# Patient Record
Sex: Female | Born: 1955 | Race: White | Hispanic: No | Marital: Married | Smoking: Never smoker
Health system: Southern US, Community
[De-identification: ages and names within clinical notes are randomized; demographics above are authoritative.]

## PROBLEM LIST (undated history)

## (undated) DIAGNOSIS — M1990 Unspecified osteoarthritis, unspecified site: Secondary | ICD-10-CM

## (undated) DIAGNOSIS — A6920 Lyme disease, unspecified: Secondary | ICD-10-CM

## (undated) DIAGNOSIS — E538 Deficiency of other specified B group vitamins: Secondary | ICD-10-CM

## (undated) DIAGNOSIS — N6009 Solitary cyst of unspecified breast: Secondary | ICD-10-CM

## (undated) DIAGNOSIS — N939 Abnormal uterine and vaginal bleeding, unspecified: Secondary | ICD-10-CM

## (undated) DIAGNOSIS — Z9289 Personal history of other medical treatment: Secondary | ICD-10-CM

## (undated) DIAGNOSIS — M8580 Other specified disorders of bone density and structure, unspecified site: Secondary | ICD-10-CM

## (undated) DIAGNOSIS — N6011 Diffuse cystic mastopathy of right breast: Secondary | ICD-10-CM

## (undated) DIAGNOSIS — D649 Anemia, unspecified: Secondary | ICD-10-CM

## (undated) HISTORY — DX: Abnormal uterine and vaginal bleeding, unspecified: N93.9

## (undated) HISTORY — DX: Personal history of other medical treatment: Z92.89

## (undated) HISTORY — DX: Lyme disease, unspecified: A69.20

## (undated) HISTORY — DX: Other specified disorders of bone density and structure, unspecified site: M85.80

## (undated) HISTORY — DX: Unspecified osteoarthritis, unspecified site: M19.90

## (undated) HISTORY — PX: BREAST CYST ASPIRATION: SHX578

## (undated) HISTORY — DX: Solitary cyst of unspecified breast: N60.09

---

## 2004-10-12 ENCOUNTER — Ambulatory Visit: Payer: Self-pay | Admitting: Unknown Physician Specialty

## 2005-11-26 HISTORY — PX: ENDOMETRIAL BIOPSY: SHX622

## 2005-12-04 ENCOUNTER — Ambulatory Visit: Payer: Self-pay | Admitting: Unknown Physician Specialty

## 2006-12-10 ENCOUNTER — Ambulatory Visit: Payer: Self-pay | Admitting: Unknown Physician Specialty

## 2007-04-10 ENCOUNTER — Ambulatory Visit: Payer: Self-pay | Admitting: Gastroenterology

## 2007-04-10 HISTORY — PX: COLONOSCOPY: SHX174

## 2008-01-06 ENCOUNTER — Ambulatory Visit: Payer: Self-pay | Admitting: Unknown Physician Specialty

## 2009-01-09 ENCOUNTER — Ambulatory Visit: Payer: Self-pay | Admitting: General Surgery

## 2010-02-05 ENCOUNTER — Ambulatory Visit: Payer: Self-pay | Admitting: General Surgery

## 2011-02-07 ENCOUNTER — Ambulatory Visit: Payer: Self-pay | Admitting: General Surgery

## 2012-02-18 ENCOUNTER — Ambulatory Visit: Payer: Self-pay | Admitting: Internal Medicine

## 2016-04-29 DIAGNOSIS — E538 Deficiency of other specified B group vitamins: Secondary | ICD-10-CM | POA: Insufficient documentation

## 2016-04-29 DIAGNOSIS — Z8619 Personal history of other infectious and parasitic diseases: Secondary | ICD-10-CM | POA: Insufficient documentation

## 2016-05-14 ENCOUNTER — Other Ambulatory Visit: Payer: Self-pay | Admitting: Obstetrics and Gynecology

## 2016-05-14 DIAGNOSIS — Z1231 Encounter for screening mammogram for malignant neoplasm of breast: Secondary | ICD-10-CM

## 2016-05-14 LAB — HM PAP SMEAR

## 2016-06-03 ENCOUNTER — Ambulatory Visit
Admission: RE | Admit: 2016-06-03 | Discharge: 2016-06-03 | Disposition: A | Discharge status: Home / Self Care | Payer: BC Managed Care – PPO | Source: Ambulatory Visit | Attending: Obstetrics and Gynecology | Admitting: Obstetrics and Gynecology

## 2016-06-03 ENCOUNTER — Other Ambulatory Visit: Payer: Self-pay | Admitting: Obstetrics and Gynecology

## 2016-06-03 DIAGNOSIS — Z1231 Encounter for screening mammogram for malignant neoplasm of breast: Secondary | ICD-10-CM

## 2016-06-03 LAB — HM MAMMOGRAPHY

## 2016-10-21 DIAGNOSIS — A6920 Lyme disease, unspecified: Secondary | ICD-10-CM

## 2016-10-21 HISTORY — DX: Lyme disease, unspecified: A69.20

## 2017-02-20 ENCOUNTER — Other Ambulatory Visit: Payer: Self-pay | Admitting: Obstetrics and Gynecology

## 2017-02-20 DIAGNOSIS — Z1231 Encounter for screening mammogram for malignant neoplasm of breast: Secondary | ICD-10-CM

## 2017-05-15 ENCOUNTER — Ambulatory Visit (INDEPENDENT_AMBULATORY_CARE_PROVIDER_SITE_OTHER): Payer: BC Managed Care – PPO | Admitting: Obstetrics and Gynecology

## 2017-05-15 ENCOUNTER — Encounter: Payer: Self-pay | Admitting: Obstetrics and Gynecology

## 2017-05-15 VITALS — BP 108/62 | HR 79 | Ht 65.5 in | Wt 131.0 lb

## 2017-05-15 DIAGNOSIS — Z01419 Encounter for gynecological examination (general) (routine) without abnormal findings: Secondary | ICD-10-CM | POA: Diagnosis not present

## 2017-05-15 DIAGNOSIS — Z1231 Encounter for screening mammogram for malignant neoplasm of breast: Secondary | ICD-10-CM

## 2017-05-15 DIAGNOSIS — I341 Nonrheumatic mitral (valve) prolapse: Secondary | ICD-10-CM | POA: Insufficient documentation

## 2017-05-15 DIAGNOSIS — N6011 Diffuse cystic mastopathy of right breast: Secondary | ICD-10-CM | POA: Insufficient documentation

## 2017-05-15 DIAGNOSIS — Z1239 Encounter for other screening for malignant neoplasm of breast: Secondary | ICD-10-CM

## 2017-05-15 DIAGNOSIS — N6012 Diffuse cystic mastopathy of left breast: Secondary | ICD-10-CM

## 2017-05-15 NOTE — Progress Notes (Signed)
Chief Complaint  Patient presents with  . Gynecologic Exam    HPI:      [name] is a 61 y.o. [phone] who LMP was No LMP recorded. Patient is postmenopausal., presents today for her annual examination.  Her menses are absent and she is postmenopausal. She does not have intermenstrual bleeding.  She does have vasomotor sx that are mostly tolerable.  Sex activity: single partner, contraception - post menopausal status. She does have vaginal dryness and uses lubricants with relief.  Last Pap: May 14, 2016  Results were: no abnormalities /neg HPV DNA.  Hx of STDs: none  Last mammogram: June 03, 2016  Results were: normal--routine follow-up in 12 months There is no FH of breast cancer. There is no FH of ovarian cancer. The patient does do self-breast exams.  Colonoscopy: colonoscopy 10 years ago without abnormalities. She is due for repeat colonoscopy and has it scheduled this yr.   Tobacco use: The patient denies current or previous tobacco use. Alcohol use: none Exercise: moderately active  She does get adequate calcium and Vitamin D in her diet.  Labs with PCP.  Past Medical History:  Diagnosis Date  . Abnormal uterine bleeding   . History of mammogram 05/12/15; 06/03/16   BIRAD 2; NEG  . History of Papanicolaou smear of cervix 05/12/2015; 05/14/2016   NEG; -/-  . Lyme disease 2018  . Solitary cyst of breast    NEG    Past Surgical History:  Procedure Laterality Date  . BREAST CYST ASPIRATION Bilateral 07/29/97;02/28/00;111/4/00   RT; LT; LT; RT  . [place]  . ENDOMETRIAL BIOPSY  11/26/2005   EARLY SECRETORY ENDOMETRIUM C SLIGHTLY DISORDERED PATTERN    Family History  Problem Relation Age of Onset  . Coronary artery disease Mother   . Cerebrovascular Accident Mother   . Hyperlipidemia Mother   . Hypertension Mother   . Chronic Renal Failure Mother     Social History   Social History  . Marital status: Married    Spouse name: N/A   . Number of children: 3  . Years of education: 8   Occupational History  . TEACHER    Social History Main Topics  . Smoking status: Never Smoker  . Smokeless tobacco: Never Used  . Alcohol use No  . Drug use: No  . Sexual activity: Yes    Birth control/ protection: Post-menopausal   Other Topics Concern  . Not on file   Social History Narrative  . No narrative on file     Current Outpatient Prescriptions:  .  aspirin 81 MG chewable tablet, Chew 81 mg by mouth daily., Disp: , Rfl:  .  Cholecalciferol (VITAMIN D3) 1000 units CAPS, Take 1 capsule by mouth daily., Disp: , Rfl:  .  Cobalamine Combinations (VITAMIN B12-FOLIC ACID) 892-119 MCG TABS, Take 1 tablet by mouth daily., Disp: , Rfl:  .  vitamin C (ASCORBIC ACID) 500 MG tablet, Take 500 mg by mouth daily., Disp: , Rfl:    ROS:  Review of Systems  Constitutional: Negative for fatigue, fever and unexpected weight change.  Respiratory: Negative for cough, shortness of breath and wheezing.   Cardiovascular: Negative for chest pain, palpitations and leg swelling.  Gastrointestinal: Negative for blood in stool, constipation, diarrhea, nausea and vomiting.  Endocrine: Negative for cold intolerance, heat intolerance and polyuria.  Genitourinary: Negative for dyspareunia, dysuria, flank pain, frequency, genital sores, hematuria, menstrual problem, pelvic pain, urgency, vaginal bleeding, vaginal discharge  and vaginal pain.  Musculoskeletal: Negative for back pain, joint swelling and myalgias.  Skin: Negative for rash.  Neurological: Negative for dizziness, syncope, light-headedness, numbness and headaches.  Hematological: Negative for adenopathy.  Psychiatric/Behavioral: Negative for agitation, confusion, sleep disturbance and suicidal ideas. The patient is not nervous/anxious.      Objective: BP 108/62 (BP Location: Left Arm, Patient Position: Sitting, Cuff Size: Normal)   Pulse 79   Ht 5' 5.5" (1.664 m)   Wt 131 lb  (59.4 kg)   BMI 21.47 kg/m    Physical Exam  Constitutional: She is oriented to person, place, and time. She appears well-developed and well-nourished.  Genitourinary: Vagina normal and uterus normal. There is no rash or tenderness on the right labia. There is no rash or tenderness on the left labia. No erythema or tenderness in the vagina. No vaginal discharge found. Right adnexum does not display mass and does not display tenderness. Left adnexum does not display mass and does not display tenderness. Cervix does not exhibit motion tenderness or polyp. Uterus is not enlarged or tender.  Neck: Normal range of motion. No thyromegaly present.  Cardiovascular: Normal rate, regular rhythm and normal heart sounds.   No murmur heard. Pulmonary/Chest: Effort normal and breath sounds normal. Right breast exhibits no mass, no nipple discharge, no skin change and no tenderness. Left breast exhibits no mass, no nipple discharge, no skin change and no tenderness.  Abdominal: Soft. There is no tenderness. There is no guarding.  Musculoskeletal: Normal range of motion.  Neurological: She is alert and oriented to person, place, and time. No cranial nerve deficit.  Psychiatric: She has a normal mood and affect. Her behavior is normal.  Vitals reviewed.    Assessment/Plan:  Encounter for annual routine gynecological examination  Screening for breast cancer - Pt has mammo sched 8/18.          GYN counsel mammography screening, adequate intake of calcium and vitamin D, diet and exercise    F/U  Return in about 1 year (around 05/15/2018).  Paola Flynt B. Aeris Hersman, PA-C 05/15/2017 8:56 AM

## 2017-06-04 ENCOUNTER — Ambulatory Visit
Admission: RE | Admit: 2017-06-04 | Discharge: 2017-06-04 | Disposition: A | Discharge status: Home / Self Care | Payer: BC Managed Care – PPO | Source: Ambulatory Visit | Attending: Obstetrics and Gynecology | Admitting: Obstetrics and Gynecology

## 2017-06-04 ENCOUNTER — Other Ambulatory Visit: Payer: Self-pay | Admitting: Obstetrics and Gynecology

## 2017-06-04 DIAGNOSIS — Z1231 Encounter for screening mammogram for malignant neoplasm of breast: Secondary | ICD-10-CM

## 2017-06-05 ENCOUNTER — Encounter: Payer: Self-pay | Admitting: Obstetrics and Gynecology

## 2017-08-15 HISTORY — PX: COLONOSCOPY: SHX174

## 2018-04-27 ENCOUNTER — Other Ambulatory Visit: Payer: Self-pay | Admitting: Obstetrics and Gynecology

## 2018-04-27 ENCOUNTER — Other Ambulatory Visit: Payer: Self-pay | Admitting: Internal Medicine

## 2018-04-27 DIAGNOSIS — Z1231 Encounter for screening mammogram for malignant neoplasm of breast: Secondary | ICD-10-CM

## 2018-06-05 ENCOUNTER — Ambulatory Visit
Admission: RE | Admit: 2018-06-05 | Discharge: 2018-06-05 | Disposition: A | Discharge status: Home / Self Care | Payer: BC Managed Care – PPO | Source: Ambulatory Visit | Attending: Internal Medicine | Admitting: Internal Medicine

## 2018-06-05 DIAGNOSIS — Z1231 Encounter for screening mammogram for malignant neoplasm of breast: Secondary | ICD-10-CM | POA: Diagnosis present

## 2018-06-09 ENCOUNTER — Other Ambulatory Visit: Payer: Self-pay | Admitting: Internal Medicine

## 2018-06-09 DIAGNOSIS — R928 Other abnormal and inconclusive findings on diagnostic imaging of breast: Secondary | ICD-10-CM

## 2018-07-03 ENCOUNTER — Ambulatory Visit
Admission: RE | Admit: 2018-07-03 | Discharge: 2018-07-03 | Disposition: A | Discharge status: Home / Self Care | Payer: BC Managed Care – PPO | Source: Ambulatory Visit | Attending: Internal Medicine | Admitting: Internal Medicine

## 2018-07-03 DIAGNOSIS — R928 Other abnormal and inconclusive findings on diagnostic imaging of breast: Secondary | ICD-10-CM | POA: Diagnosis present

## 2018-07-27 ENCOUNTER — Ambulatory Visit (INDEPENDENT_AMBULATORY_CARE_PROVIDER_SITE_OTHER): Payer: BC Managed Care – PPO | Admitting: Obstetrics and Gynecology

## 2018-07-27 ENCOUNTER — Encounter: Payer: Self-pay | Admitting: Obstetrics and Gynecology

## 2018-07-27 VITALS — BP 126/60 | HR 82 | Ht 66.0 in | Wt 142.0 lb

## 2018-07-27 DIAGNOSIS — Z01419 Encounter for gynecological examination (general) (routine) without abnormal findings: Secondary | ICD-10-CM | POA: Diagnosis not present

## 2018-07-27 DIAGNOSIS — Z1239 Encounter for other screening for malignant neoplasm of breast: Secondary | ICD-10-CM | POA: Diagnosis not present

## 2018-07-27 NOTE — Patient Instructions (Signed)
I value your feedback and entrusting us with your care. If you get a [facility] patient survey, I would appreciate you taking the time to let us know about your experience today. Thank you! 

## 2018-07-27 NOTE — Progress Notes (Signed)
Chief Complaint  Patient presents with  . Gynecologic Exam    HPI:      [name] is a 62 y.o. [phone] who LMP was No LMP recorded. Patient is postmenopausal., presents today for her annual examination.  Her menses are absent and she is postmenopausal. She does not have intermenstrual bleeding.  She does have vasomotor sx that are tolerable.  Sex activity: single partner, contraception - post menopausal status. She does have vaginal dryness and uses lubricants with relief.  Last Pap: May 14, 2016  Results were: no abnormalities /neg HPV DNA.  Hx of STDs: none  Last mammogram: 07/03/18  Results were: normal--routine follow-up in 12 months There is no FH of breast cancer. There is no FH of ovarian cancer. The patient does do self-breast exams.  Colonoscopy: colonoscopy <1 yr ago with abnormalities. Repeat due after 5 yrs.  Tobacco use: The patient denies current or previous tobacco use. Alcohol use: none Exercise: moderately active  She does get adequate calcium and Vitamin D in her diet.  Labs with PCP.  Past Medical History:  Diagnosis Date  . Abnormal uterine bleeding   . History of mammogram 05/12/15; 06/03/16   BIRAD 2; NEG  . History of Papanicolaou smear of cervix 05/12/2015; 05/14/2016   NEG; -/-  . Lyme disease 2018  . Solitary cyst of breast    NEG    Past Surgical History:  Procedure Laterality Date  . BREAST CYST ASPIRATION Bilateral 07/29/97;02/28/00;111/4/00   RT; LT; LT; RT  . [place]  . ENDOMETRIAL BIOPSY  11/26/2005   EARLY SECRETORY ENDOMETRIUM C SLIGHTLY DISORDERED PATTERN    Family History  Problem Relation Age of Onset  . Coronary artery disease Mother   . Cerebrovascular Accident Mother   . Hyperlipidemia Mother   . Hypertension Mother   . Chronic Renal Failure Mother   . Hypercholesterolemia Mother   . Transient ischemic attack Father 71  . Breast cancer Neg Hx     Social History   Socioeconomic History  .  Marital status: Married    Spouse name: Not on file  . Number of children: 3  . Years of education: 56  . Highest education level: Not on file  Occupational History  . Occupation: TEACHER  Social Needs  . Financial resource strain: Not on file  . Food insecurity:    Worry: Not on file    Inability: Not on file  . Transportation needs:    Medical: Not on file    Non-medical: Not on file  Tobacco Use  . Smoking status: Never Smoker  . Smokeless tobacco: Never Used  Substance and Sexual Activity  . Alcohol use: No  . Drug use: No  . Sexual activity: Yes    Birth control/protection: Post-menopausal  Lifestyle  . Physical activity:    Days per week: Not on file    Minutes per session: Not on file  . Stress: Not on file  Relationships  . Social connections:    Talks on phone: Not on file    Gets together: Not on file    Attends religious service: Not on file    Active member of club or organization: Not on file    Attends meetings of clubs or organizations: Not on file    Relationship status: Not on file  . Intimate partner violence:    Fear of current or ex partner: Not on file    Emotionally abused: Not on file  Physically abused: Not on file    Forced sexual activity: Not on file  Other Topics Concern  . Not on file  Social History Narrative  . Not on file     Current Outpatient Medications:  .  acyclovir ointment (ZOVIRAX) 5 %, Apply topically., Disp: , Rfl:  .  aspirin 81 MG chewable tablet, Chew 81 mg by mouth daily., Disp: , Rfl:  .  Cholecalciferol (VITAMIN D3) 1000 units CAPS, Take 1 capsule by mouth daily., Disp: , Rfl:  .  vitamin C (ASCORBIC ACID) 500 MG tablet, Take 500 mg by mouth daily., Disp: , Rfl:    ROS:  Review of Systems  Constitutional: Negative for fatigue, fever and unexpected weight change.  Respiratory: Negative for cough, shortness of breath and wheezing.   Cardiovascular: Negative for chest pain, palpitations and leg swelling.    Gastrointestinal: Negative for blood in stool, constipation, diarrhea, nausea and vomiting.  Endocrine: Negative for cold intolerance, heat intolerance and polyuria.  Genitourinary: Negative for dyspareunia, dysuria, flank pain, frequency, genital sores, hematuria, menstrual problem, pelvic pain, urgency, vaginal bleeding, vaginal discharge and vaginal pain.  Musculoskeletal: Negative for back pain, joint swelling and myalgias.  Skin: Negative for rash.  Neurological: Negative for dizziness, syncope, light-headedness, numbness and headaches.  Hematological: Negative for adenopathy.  Psychiatric/Behavioral: Negative for agitation, confusion, sleep disturbance and suicidal ideas. The patient is not nervous/anxious.      Objective: BP 126/60   Pulse 82   Ht 5\' 6"  (1.676 m)   Wt 142 lb (64.4 kg)   BMI 22.92 kg/m    Physical Exam  Constitutional: She is oriented to person, place, and time. She appears well-developed and well-nourished.  Genitourinary: Vagina normal and uterus normal. There is no rash or tenderness on the right labia. There is no rash or tenderness on the left labia. No erythema or tenderness in the vagina. No vaginal discharge found. Right adnexum does not display mass and does not display tenderness. Left adnexum does not display mass and does not display tenderness. Cervix does not exhibit motion tenderness or polyp. Uterus is not enlarged or tender.  Neck: Normal range of motion. No thyromegaly present.  Cardiovascular: Normal rate, regular rhythm and normal heart sounds.  No murmur heard. Pulmonary/Chest: Effort normal and breath sounds normal. Right breast exhibits no mass, no nipple discharge, no skin change and no tenderness. Left breast exhibits no mass, no nipple discharge, no skin change and no tenderness.  Abdominal: Soft. There is no tenderness. There is no guarding.  Musculoskeletal: Normal range of motion.  Neurological: She is alert and oriented to person,  place, and time. No cranial nerve deficit.  Psychiatric: She has a normal mood and affect. Her behavior is normal.  Vitals reviewed.    Assessment/Plan:  Encounter for annual routine gynecological examination  Screening for breast cancer - Pt current on mammo          GYN counsel mammography screening, adequate intake of calcium and vitamin D, diet and exercise    F/U  Return in about 1 year (around 07/28/2019).  Alicia B. Copland, PA-C 07/27/2018 4:22 PM

## 2018-08-04 ENCOUNTER — Encounter: Payer: Self-pay | Admitting: Obstetrics and Gynecology

## 2018-08-04 ENCOUNTER — Ambulatory Visit (INDEPENDENT_AMBULATORY_CARE_PROVIDER_SITE_OTHER): Payer: BC Managed Care – PPO | Admitting: Obstetrics and Gynecology

## 2018-08-04 VITALS — BP 144/70 | Ht 66.0 in | Wt 140.0 lb

## 2018-08-04 DIAGNOSIS — R3121 Asymptomatic microscopic hematuria: Secondary | ICD-10-CM

## 2018-08-04 DIAGNOSIS — R102 Pelvic and perineal pain: Secondary | ICD-10-CM

## 2018-08-04 LAB — POCT URINALYSIS DIPSTICK
BILIRUBIN UA: NEGATIVE
Glucose, UA: NEGATIVE
KETONES UA: NEGATIVE
Leukocytes, UA: NEGATIVE
Nitrite, UA: NEGATIVE
PH UA: 6.5 (ref 5.0–8.0)
Protein, UA: NEGATIVE
SPEC GRAV UA: 1.015 (ref 1.010–1.025)

## 2018-08-04 NOTE — Patient Instructions (Signed)
I value your feedback and entrusting us with your care. If you get a [facility] patient survey, I would appreciate you taking the time to let us know about your experience today. Thank you! 

## 2018-08-04 NOTE — Progress Notes (Signed)
Rusty Aus, MD   Chief Complaint  Patient presents with  . Pelvic Pain    since last wednesday pelvic pain, and sat had a fever, on tamiflu since then even though test was neg    HPI:      Ms. KAEDYNCE TAPP is a 62 y.o. [phone] who LMP was No LMP recorded. Patient is postmenopausal., presents today for pelvic discomfort since last wk. Started out with bloating and lower pelvic ache. Then developed diffuse myalgias, arthralgias, fever, headache. Had neg flu test but started on tamiflu anyway at Urgent Care a few days ago. Fevers, myalgias, HA resolved but pt still has achy pelvic sx. Hurts to sit. Tried NSAIDs last night; sx worse at night, better during the day. Has mild suprapubic pressure/ mild dysuria, LBP. No vag sx, no frequency/urgency. No GI sx. Hx of microscopic hematuria on UA with PCP that resolved with rechk.  No recent hx of UTIs but had them in the past. Last annual last wk.  Past Medical History:  Diagnosis Date  . Abnormal uterine bleeding   . History of mammogram 05/12/15; 06/03/16   BIRAD 2; NEG  . History of Papanicolaou smear of cervix 05/12/2015; 05/14/2016   NEG; -/-  . Lyme disease 2018  . Solitary cyst of breast    NEG    Past Surgical History:  Procedure Laterality Date  . BREAST CYST ASPIRATION Bilateral 07/29/97;02/28/00;111/4/00   RT; LT; LT; RT  . [place]  . ENDOMETRIAL BIOPSY  11/26/2005   EARLY SECRETORY ENDOMETRIUM C SLIGHTLY DISORDERED PATTERN    Family History  Problem Relation Age of Onset  . Coronary artery disease Mother   . Cerebrovascular Accident Mother   . Hyperlipidemia Mother   . Hypertension Mother   . Chronic Renal Failure Mother   . Hypercholesterolemia Mother   . Transient ischemic attack Father 54  . Breast cancer Neg Hx     Social History   Socioeconomic History  . Marital status: Married    Spouse name: Not on file  . Number of children: 3  . Years of education: 13  . Highest education level:  Not on file  Occupational History  . Occupation: TEACHER  Social Needs  . Financial resource strain: Not on file  . Food insecurity:    Worry: Not on file    Inability: Not on file  . Transportation needs:    Medical: Not on file    Non-medical: Not on file  Tobacco Use  . Smoking status: Never Smoker  . Smokeless tobacco: Never Used  Substance and Sexual Activity  . Alcohol use: No  . Drug use: No  . Sexual activity: Yes    Birth control/protection: Post-menopausal  Lifestyle  . Physical activity:    Days per week: Not on file    Minutes per session: Not on file  . Stress: Not on file  Relationships  . Social connections:    Talks on phone: Not on file    Gets together: Not on file    Attends religious service: Not on file    Active member of club or organization: Not on file    Attends meetings of clubs or organizations: Not on file    Relationship status: Not on file  . Intimate partner violence:    Fear of current or ex partner: Not on file    Emotionally abused: Not on file    Physically abused: Not on file  Forced sexual activity: Not on file  Other Topics Concern  . Not on file  Social History Narrative  . Not on file    Outpatient Medications Prior to Visit  Medication Sig Dispense Refill  . aspirin 81 MG chewable tablet Chew 81 mg by mouth daily.    . Cholecalciferol (VITAMIN D3) 1000 units CAPS Take 1 capsule by mouth daily.    Marland Kitchen ibuprofen (ADVIL,MOTRIN) 200 MG tablet Take by mouth.    . oseltamivir (TAMIFLU) 75 MG capsule Take by mouth.    . vitamin C (ASCORBIC ACID) 500 MG tablet Take 500 mg by mouth daily.    Marland Kitchen acyclovir ointment (ZOVIRAX) 5 % Apply topically.     No facility-administered medications prior to visit.       ROS:  Review of Systems  Constitutional: Positive for fever. Negative for fatigue and unexpected weight change.  Respiratory: Negative for cough, shortness of breath and wheezing.   Cardiovascular: Negative for chest pain,  palpitations and leg swelling.  Gastrointestinal: Negative for blood in stool, constipation, diarrhea, nausea and vomiting.  Endocrine: Negative for cold intolerance, heat intolerance and polyuria.  Genitourinary: Positive for dysuria and pelvic pain. Negative for dyspareunia, flank pain, frequency, genital sores, hematuria, menstrual problem, urgency, vaginal bleeding, vaginal discharge and vaginal pain.  Musculoskeletal: Positive for arthralgias, back pain and myalgias. Negative for joint swelling.  Skin: Negative for rash.  Neurological: Positive for headaches. Negative for dizziness, syncope, light-headedness and numbness.  Hematological: Negative for adenopathy.  Psychiatric/Behavioral: Negative for agitation, confusion, sleep disturbance and suicidal ideas. The patient is not nervous/anxious.     OBJECTIVE:   Vitals:  BP (!) 144/70   Ht 5\' 6"  (1.676 m)   Wt 140 lb (63.5 kg)   BMI 22.60 kg/m   Physical Exam  Constitutional: She is oriented to person, place, and time. Vital signs are normal. She appears well-developed.  Neck: Normal range of motion.  Pulmonary/Chest: Effort normal.  Abdominal: Soft. There is no tenderness. There is no CVA tenderness.  Genitourinary: Vagina normal and uterus normal. There is no rash, tenderness or lesion on the right labia. There is no rash, tenderness or lesion on the left labia. Uterus is not enlarged and not tender. Cervix exhibits no motion tenderness. Right adnexum displays no mass and no tenderness. Left adnexum displays no mass and no tenderness. No erythema or tenderness in the vagina. No vaginal discharge found.  Musculoskeletal: Normal range of motion.  Neurological: She is alert and oriented to person, place, and time. No cranial nerve deficit.  Psychiatric: She has a normal mood and affect. Her behavior is normal. Judgment and thought content normal.  Vitals reviewed.   Results: Results for orders placed or performed in visit on  08/04/18 (from the past 24 hour(s))  POCT Urinalysis Dipstick     Status: Abnormal   Collection Time: 08/04/18  2:11 PM  Result Value Ref Range   Color, UA yellow    Clarity, UA clear    Glucose, UA Negative Negative   Bilirubin, UA neg    Ketones, UA neg    Spec Grav, UA 1.015 1.010 - 1.025   Blood, UA mod    pH, UA 6.5 5.0 - 8.0   Protein, UA Negative Negative   Urobilinogen, UA     Nitrite, UA neg    Leukocytes, UA Negative Negative   Appearance     Odor       Assessment/Plan: Pelvic pain - Neg exam. Sx improved in  day, worse at night. With viral illness. Question related. Hematuria on dip. Check C&S. Will f/u wiht results/NSAIDs. If neg, chk u/s - Plan: POCT Urinalysis Dipstick, Urine Culture  Asymptomatic microscopic hematuria - If culture neg, will rechk UA. - Plan: POCT Urinalysis Dipstick, Urine Culture    Return if symptoms worsen or fail to improve.  Alicia B. Copland, PA-C 08/04/2018 2:17 PM

## 2018-08-06 ENCOUNTER — Telehealth: Payer: Self-pay | Admitting: Obstetrics and Gynecology

## 2018-08-06 LAB — URINE CULTURE: ORGANISM ID, BACTERIA: NO GROWTH

## 2018-08-06 NOTE — Telephone Encounter (Signed)
Pt aware of neg C&S. Had hematuria on UA with hx of that in past. Pt taking NSAIDs for pelvic pain. Pain is improving.  RTO in 2 wks for urine rechk. Pt to make nurse appt for UA.

## 2018-08-31 ENCOUNTER — Ambulatory Visit (INDEPENDENT_AMBULATORY_CARE_PROVIDER_SITE_OTHER): Payer: BC Managed Care – PPO

## 2018-08-31 DIAGNOSIS — Z09 Encounter for follow-up examination after completed treatment for conditions other than malignant neoplasm: Secondary | ICD-10-CM

## 2018-08-31 LAB — POCT URINALYSIS DIPSTICK
Bilirubin, UA: NEGATIVE
Blood, UA: NEGATIVE
Glucose, UA: NEGATIVE
Ketones, UA: NEGATIVE
LEUKOCYTES UA: NEGATIVE
NITRITE UA: NEGATIVE
PH UA: 5 (ref 5.0–8.0)
Protein, UA: POSITIVE — AB
Spec Grav, UA: 1.015 (ref 1.010–1.025)
Urobilinogen, UA: 0.2 E.U./dL

## 2018-08-31 NOTE — Progress Notes (Signed)
Any blood in her urine?

## 2018-09-01 NOTE — Progress Notes (Signed)
LM for pt that urine doesn't show any blood. Nothing else to do at this time.

## 2018-09-01 NOTE — Progress Notes (Signed)
Done

## 2018-09-01 NOTE — Progress Notes (Signed)
Ok, can you edit result to put the normals in so we'll know in future when we look back at labs? Thx.

## 2019-08-12 NOTE — Progress Notes (Signed)
Chief Complaint  Patient presents with  . Gynecologic Exam    HPI:      [name] is a 63 y.o. [phone] who LMP was No LMP recorded. Patient is postmenopausal., presents today for her annual examination.  Her menses are absent and she is postmenopausal. She does not have intermenstrual bleeding. Pelvic pain from 10/19 resolved.  She does have vasomotor sx that are tolerable.  Sex activity: single partner, contraception - post menopausal status. She does have vaginal dryness and uses lubricants with relief.  Last Pap: May 14, 2016  Results were: no abnormalities /neg HPV DNA.  Hx of STDs: none  Last mammogram: 07/03/18  Results were: normal--routine follow-up in 12 months There is no FH of breast cancer. There is no FH of ovarian cancer. The patient does do self-breast exams.  Colonoscopy: colonoscopy 2 yrs  ago with abnormalities. Repeat due after 5 yrs.  Tobacco use: The patient denies current or previous tobacco use. Alcohol use: none  No drug use.  Exercise: moderately active  She does get adequate calcium and Vitamin D in her diet.  Labs with PCP.  Past Medical History:  Diagnosis Date  . Abnormal uterine bleeding   . History of mammogram 05/12/15; 06/03/16   BIRAD 2; NEG  . History of Papanicolaou smear of cervix 05/12/2015; 05/14/2016   NEG; -/-  . Lyme disease 2018  . Solitary cyst of breast    NEG    Past Surgical History:  Procedure Laterality Date  . BREAST CYST ASPIRATION Bilateral 07/29/97;02/28/00;111/4/00   RT; LT; LT; RT  . [place]  . ENDOMETRIAL BIOPSY  11/26/2005   EARLY SECRETORY ENDOMETRIUM C SLIGHTLY DISORDERED PATTERN    Family History  Problem Relation Age of Onset  . Coronary artery disease Mother   . Cerebrovascular Accident Mother   . Hyperlipidemia Mother   . Hypertension Mother   . Chronic Renal Failure Mother   . Hypercholesterolemia Mother   . Transient ischemic attack Father 59  . Breast cancer Neg Hx      Social History   Socioeconomic History  . Marital status: Married    Spouse name: Not on file  . Number of children: 3  . Years of education: 69  . Highest education level: Not on file  Occupational History  . Occupation: TEACHER  Social Needs  . Financial resource strain: Not on file  . Food insecurity    Worry: Not on file    Inability: Not on file  . Transportation needs    Medical: Not on file    Non-medical: Not on file  Tobacco Use  . Smoking status: Never Smoker  . Smokeless tobacco: Never Used  Substance and Sexual Activity  . Alcohol use: No  . Drug use: No  . Sexual activity: Yes    Birth control/protection: Post-menopausal  Lifestyle  . Physical activity    Days per week: Not on file    Minutes per session: Not on file  . Stress: Not on file  Relationships  . Social Herbalist on phone: Not on file    Gets together: Not on file    Attends religious service: Not on file    Active member of club or organization: Not on file    Attends meetings of clubs or organizations: Not on file    Relationship status: Not on file  . Intimate partner violence    Fear of current or ex partner:  Not on file    Emotionally abused: Not on file    Physically abused: Not on file    Forced sexual activity: Not on file  Other Topics Concern  . Not on file  Social History Narrative  . Not on file     Current Outpatient Medications:  .  aspirin 81 MG chewable tablet, Chew 81 mg by mouth daily., Disp: , Rfl:  .  Cholecalciferol (VITAMIN D3) 1000 units CAPS, Take 1 capsule by mouth daily., Disp: , Rfl:  .  Cyanocobalamin (VITAMIN B 12 PO), Take by mouth., Disp: , Rfl:  .  Multiple Vitamins-Minerals (ZINC PO), Take by mouth., Disp: , Rfl:  .  vitamin C (ASCORBIC ACID) 500 MG tablet, Take 500 mg by mouth daily., Disp: , Rfl:    ROS:  Review of Systems  Constitutional: Negative for fatigue, fever and unexpected weight change.  Respiratory: Negative for cough,  shortness of breath and wheezing.   Cardiovascular: Negative for chest pain, palpitations and leg swelling.  Gastrointestinal: Negative for blood in stool, constipation, diarrhea, nausea and vomiting.  Endocrine: Negative for cold intolerance, heat intolerance and polyuria.  Genitourinary: Negative for dyspareunia, dysuria, flank pain, frequency, genital sores, hematuria, menstrual problem, pelvic pain, urgency, vaginal bleeding, vaginal discharge and vaginal pain.  Musculoskeletal: Negative for back pain, joint swelling and myalgias.  Skin: Negative for rash.  Neurological: Negative for dizziness, syncope, light-headedness, numbness and headaches.  Hematological: Negative for adenopathy.  Psychiatric/Behavioral: Negative for agitation, confusion, sleep disturbance and suicidal ideas. The patient is not nervous/anxious.      Objective: BP 120/80   Ht 5' 6.5" (1.689 m)   Wt 148 lb (67.1 kg)   BMI 23.53 kg/m    Physical Exam Constitutional:      Appearance: She is well-developed.  Genitourinary:     Vulva, vagina, uterus, right adnexa and left adnexa normal.     No vulval lesion or tenderness noted.     No vaginal discharge, erythema or tenderness.     No cervical motion tenderness or polyp.     Uterus is not enlarged or tender.     No right or left adnexal mass present.     Right adnexa not tender.     Left adnexa not tender.  Neck:     Musculoskeletal: Normal range of motion.     Thyroid: No thyromegaly.  Cardiovascular:     Rate and Rhythm: Normal rate and regular rhythm.     Heart sounds: Normal heart sounds. No murmur.  Pulmonary:     Effort: Pulmonary effort is normal.     Breath sounds: Normal breath sounds.  Chest:     Breasts:        Right: No mass, nipple discharge, skin change or tenderness.        Left: No mass, nipple discharge, skin change or tenderness.  Abdominal:     Palpations: Abdomen is soft.     Tenderness: There is no abdominal tenderness. There is  no guarding.  Musculoskeletal: Normal range of motion.  Neurological:     General: No focal deficit present.     Mental Status: She is alert and oriented to person, place, and time.     Cranial Nerves: No cranial nerve deficit.  Skin:    General: Skin is warm and dry.  Psychiatric:        Mood and Affect: Mood normal.        Behavior: Behavior normal.  Thought Content: Thought content normal.        Judgment: Judgment normal.  Vitals signs reviewed.     Assessment/Plan:  Encounter for annual routine gynecological examination  Encounter for screening mammogram for malignant neoplasm of breast - Plan: MM 3D SCREEN BREAST BILATERAL; pt to sched mammo          GYN counsel mammography screening, adequate intake of calcium and vitamin D, diet and exercise    F/U  Return in about 1 year (around 08/12/2020).  Grenda Lora B. Afnan Emberton, PA-C 08/13/2019 4:16 PM

## 2019-08-13 ENCOUNTER — Other Ambulatory Visit: Payer: Self-pay

## 2019-08-13 ENCOUNTER — Encounter: Payer: Self-pay | Admitting: Obstetrics and Gynecology

## 2019-08-13 ENCOUNTER — Ambulatory Visit (INDEPENDENT_AMBULATORY_CARE_PROVIDER_SITE_OTHER): Payer: BC Managed Care – PPO | Admitting: Obstetrics and Gynecology

## 2019-08-13 VITALS — BP 120/80 | Ht 66.5 in | Wt 148.0 lb

## 2019-08-13 DIAGNOSIS — Z23 Encounter for immunization: Secondary | ICD-10-CM | POA: Diagnosis not present

## 2019-08-13 DIAGNOSIS — Z01419 Encounter for gynecological examination (general) (routine) without abnormal findings: Secondary | ICD-10-CM | POA: Diagnosis not present

## 2019-08-13 DIAGNOSIS — Z1231 Encounter for screening mammogram for malignant neoplasm of breast: Secondary | ICD-10-CM

## 2019-08-13 NOTE — Patient Instructions (Addendum)
I value your feedback and entrusting us with your care. If you get a [facility] patient survey, I would appreciate you taking the time to let us know about your experience today. Thank you!  [facility]: [phone]    

## 2019-08-13 NOTE — Addendum Note (Signed)
Addended by: Drenda Freeze on: 08/13/2019 04:21 PM   Modules accepted: Orders

## 2019-09-15 ENCOUNTER — Ambulatory Visit
Admission: RE | Admit: 2019-09-15 | Discharge: 2019-09-15 | Disposition: A | Discharge status: Home / Self Care | Payer: BC Managed Care – PPO | Source: Ambulatory Visit | Attending: Obstetrics and Gynecology | Admitting: Obstetrics and Gynecology

## 2019-09-15 ENCOUNTER — Encounter: Payer: Self-pay | Admitting: Obstetrics and Gynecology

## 2019-09-15 DIAGNOSIS — Z1231 Encounter for screening mammogram for malignant neoplasm of breast: Secondary | ICD-10-CM | POA: Insufficient documentation

## 2020-05-23 ENCOUNTER — Other Ambulatory Visit: Payer: Self-pay | Admitting: Internal Medicine

## 2020-05-23 DIAGNOSIS — Z1231 Encounter for screening mammogram for malignant neoplasm of breast: Secondary | ICD-10-CM

## 2020-08-14 ENCOUNTER — Ambulatory Visit: Payer: BC Managed Care – PPO | Admitting: Obstetrics and Gynecology

## 2020-08-30 ENCOUNTER — Encounter: Payer: Self-pay | Admitting: Obstetrics and Gynecology

## 2020-08-30 ENCOUNTER — Ambulatory Visit (INDEPENDENT_AMBULATORY_CARE_PROVIDER_SITE_OTHER): Payer: BC Managed Care – PPO | Admitting: Obstetrics and Gynecology

## 2020-08-30 ENCOUNTER — Other Ambulatory Visit: Payer: Self-pay

## 2020-08-30 VITALS — BP 116/70 | Ht 66.0 in | Wt 144.0 lb

## 2020-08-30 DIAGNOSIS — Z01419 Encounter for gynecological examination (general) (routine) without abnormal findings: Secondary | ICD-10-CM | POA: Diagnosis not present

## 2020-08-30 DIAGNOSIS — Z1231 Encounter for screening mammogram for malignant neoplasm of breast: Secondary | ICD-10-CM

## 2020-08-30 NOTE — Patient Instructions (Signed)
I value your feedback and entrusting us with your care. If you get a [facility] patient survey, I would appreciate you taking the time to let us know about your experience today. Thank you!  As of September 30, 2019, your lab results will be released to your MyChart immediately, before I even have a chance to see them. Please give me time to review them and contact you if there are any abnormalities. Thank you for your patience.  

## 2020-08-30 NOTE — Progress Notes (Signed)
Chief Complaint  Patient presents with  . Annual Exam    HPI:      [name] is a 64 y.o. [phone] who LMP was No LMP recorded. Patient is postmenopausal., presents today for her annual examination.  Her menses are absent and she is postmenopausal. She does not have intermenstrual bleeding. She does have tolerable vasomotor sx.  Sex activity: single partner, contraception - post menopausal status. She does have vaginal dryness and uses lubricants with relief.  Last Pap: May 14, 2016  Results were: no abnormalities /neg HPV DNA.  Hx of STDs: none  Last mammogram: 09/15/19  Results were: normal--routine follow-up in 12 months. Has appt 12/21 There is no FH of breast cancer. There is no FH of ovarian cancer. The patient does do self-breast exams.  Colonoscopy: colonoscopy 3 yrs ago with abnormalities. Repeat due after 5 yrs.  Tobacco use: The patient denies current or previous tobacco use. Alcohol use: none  No drug use.  Exercise: moderately active  She does get adequate calcium and Vitamin D in her diet.  Labs with PCP.  Past Medical History:  Diagnosis Date  . Abnormal uterine bleeding   . History of mammogram 05/12/15; 06/03/16   BIRAD 2; NEG  . History of Papanicolaou smear of cervix 05/12/2015; 05/14/2016   NEG; -/-  . Lyme disease 2018  . Solitary cyst of breast    NEG    Past Surgical History:  Procedure Laterality Date  . BREAST CYST ASPIRATION Bilateral 07/29/97;02/28/00;111/4/00   RT; LT; LT; RT  . [place]  . ENDOMETRIAL BIOPSY  11/26/2005   EARLY SECRETORY ENDOMETRIUM C SLIGHTLY DISORDERED PATTERN    Family History  Problem Relation Age of Onset  . Coronary artery disease Mother   . Cerebrovascular Accident Mother   . Hyperlipidemia Mother   . Hypertension Mother   . Chronic Renal Failure Mother   . Hypercholesterolemia Mother   . Transient ischemic attack Father 3  . Breast cancer Neg Hx     Social History    Socioeconomic History  . Marital status: Married    Spouse name: Not on file  . Number of children: 3  . Years of education: 30  . Highest education level: Not on file  Occupational History  . Occupation: TEACHER  Tobacco Use  . Smoking status: Never Smoker  . Smokeless tobacco: Never Used  Vaping Use  . Vaping Use: Never used  Substance and Sexual Activity  . Alcohol use: No  . Drug use: No  . Sexual activity: Yes    Birth control/protection: Post-menopausal  Other Topics Concern  . Not on file  Social History Narrative  . Not on file   Social Determinants of Health   Financial Resource Strain:   . Difficulty of Paying Living Expenses: Not on file  Food Insecurity:   . Worried About Charity fundraiser in the Last Year: Not on file  . Ran Out of Food in the Last Year: Not on file  Transportation Needs:   . Lack of Transportation (Medical): Not on file  . Lack of Transportation (Non-Medical): Not on file  Physical Activity:   . Days of Exercise per Week: Not on file  . Minutes of Exercise per Session: Not on file  Stress:   . Feeling of Stress : Not on file  Social Connections:   . Frequency of Communication with Friends and Family: Not on file  . Frequency of Social Gatherings  with Friends and Family: Not on file  . Attends Religious Services: Not on file  . Active Member of Clubs or Organizations: Not on file  . Attends Archivist Meetings: Not on file  . Marital Status: Not on file  Intimate Partner Violence:   . Fear of Current or Ex-Partner: Not on file  . Emotionally Abused: Not on file  . Physically Abused: Not on file  . Sexually Abused: Not on file     Current Outpatient Medications:  .  aspirin 81 MG chewable tablet, Chew 81 mg by mouth daily., Disp: , Rfl:  .  Cholecalciferol (VITAMIN D3) 1000 units CAPS, Take 1 capsule by mouth daily., Disp: , Rfl:  .  Cyanocobalamin (VITAMIN B 12 PO), Take by mouth., Disp: , Rfl:  .  Multiple  Vitamins-Minerals (ZINC PO), Take by mouth., Disp: , Rfl:  .  vitamin C (ASCORBIC ACID) 500 MG tablet, Take 500 mg by mouth daily., Disp: , Rfl:    ROS:  Review of Systems  Constitutional: Negative for fatigue, fever and unexpected weight change.  Respiratory: Negative for cough, shortness of breath and wheezing.   Cardiovascular: Negative for chest pain, palpitations and leg swelling.  Gastrointestinal: Negative for blood in stool, constipation, diarrhea, nausea and vomiting.  Endocrine: Negative for cold intolerance, heat intolerance and polyuria.  Genitourinary: Negative for dyspareunia, dysuria, flank pain, frequency, genital sores, hematuria, menstrual problem, pelvic pain, urgency, vaginal bleeding, vaginal discharge and vaginal pain.  Musculoskeletal: Negative for back pain, joint swelling and myalgias.  Skin: Negative for rash.  Neurological: Negative for dizziness, syncope, light-headedness, numbness and headaches.  Hematological: Negative for adenopathy.  Psychiatric/Behavioral: Negative for agitation, confusion, sleep disturbance and suicidal ideas. The patient is not nervous/anxious.      Objective: BP 116/70   Ht 5\' 6"  (1.676 m)   Wt 144 lb (65.3 kg)   BMI 23.24 kg/m    Physical Exam Constitutional:      Appearance: She is well-developed.  Genitourinary:     Vulva, vagina, uterus, right adnexa and left adnexa normal.     No vulval lesion or tenderness noted.     No vaginal discharge, erythema or tenderness.     No cervical motion tenderness or polyp.     Uterus is not enlarged or tender.     No right or left adnexal mass present.     Right adnexa not tender.     Left adnexa not tender.  Neck:     Thyroid: No thyromegaly.  Cardiovascular:     Rate and Rhythm: Normal rate and regular rhythm.     Heart sounds: Normal heart sounds. No murmur heard.   Pulmonary:     Effort: Pulmonary effort is normal.     Breath sounds: Normal breath sounds.  Chest:      Breasts:        Right: No mass, nipple discharge, skin change or tenderness.        Left: No mass, nipple discharge, skin change or tenderness.  Abdominal:     Palpations: Abdomen is soft.     Tenderness: There is no abdominal tenderness. There is no guarding.  Musculoskeletal:        General: Normal range of motion.     Cervical back: Normal range of motion.  Neurological:     General: No focal deficit present.     Mental Status: She is alert and oriented to person, place, and time.     Cranial Nerves:  No cranial nerve deficit.  Skin:    General: Skin is warm and dry.  Psychiatric:        Mood and Affect: Mood normal.        Behavior: Behavior normal.        Thought Content: Thought content normal.        Judgment: Judgment normal.  Vitals reviewed.     Assessment/Plan:  Encounter for annual routine gynecological examination  Encounter for screening mammogram for malignant neoplasm of breast - Plan: MM 3D SCREEN BREAST BILATERAL; pt has mammo sched          GYN counsel mammography screening, adequate intake of calcium and vitamin D, diet and exercise    F/U  Return in about 1 year (around 08/30/2021).  Carloyn Lahue B. Lowery Paullin, PA-C 08/30/2020 4:53 PM

## 2020-09-21 ENCOUNTER — Ambulatory Visit
Admission: RE | Admit: 2020-09-21 | Discharge: 2020-09-21 | Disposition: A | Discharge status: Home / Self Care | Payer: BC Managed Care – PPO | Source: Ambulatory Visit | Attending: Internal Medicine | Admitting: Internal Medicine

## 2020-09-21 ENCOUNTER — Other Ambulatory Visit: Payer: Self-pay

## 2020-09-21 DIAGNOSIS — Z1231 Encounter for screening mammogram for malignant neoplasm of breast: Secondary | ICD-10-CM

## 2021-08-09 ENCOUNTER — Other Ambulatory Visit: Payer: Self-pay | Admitting: Internal Medicine

## 2021-08-09 DIAGNOSIS — Z1231 Encounter for screening mammogram for malignant neoplasm of breast: Secondary | ICD-10-CM

## 2021-09-03 NOTE — Progress Notes (Signed)
Chief Complaint  Patient presents with   Gynecologic Exam    No concerns   Injections    Flu shot    HPI:      [name] is a 65 y.o. [phone] who LMP was No LMP recorded. Patient is postmenopausal., presents today for her MEDICARE annual examination.  Her menses are absent and she is postmenopausal. She does not have PMB. She does have tolerable vasomotor sx.  Sex activity: single partner, contraception - post menopausal status. She does have vaginal dryness and uses lubricants with relief.  Last Pap: May 14, 2016  Results were: no abnormalities /neg HPV DNA.  Hx of STDs: none  Last mammogram: 09/21/20  Results were: normal--routine follow-up in 12 months. Has appt 12/22 There is no FH of breast cancer. There is no FH of ovarian cancer. The patient does do self-breast exams.  Colonoscopy: colonoscopy 10/18 with abnormalities. Repeat due after 5 yrs. DEXA: never  Tobacco use: The patient denies current or previous tobacco use. Alcohol use: none  No drug use.  Exercise: moderately active  She does get adequate calcium and Vitamin D in her diet.  Labs with PCP.   Past Medical History:  Diagnosis Date   Abnormal uterine bleeding    History of mammogram 05/12/15; 06/03/16   BIRAD 2; NEG   History of Papanicolaou smear of cervix 05/12/2015; 05/14/2016   NEG; -/-   Lyme disease 2018   Solitary cyst of breast    NEG    Past Surgical History:  Procedure Laterality Date   BREAST CYST ASPIRATION Bilateral 07/29/97;02/28/00;111/4/00   RT; LT; LT; RT   [place]   ENDOMETRIAL BIOPSY  11/26/2005   EARLY SECRETORY ENDOMETRIUM C SLIGHTLY DISORDERED PATTERN    Family History  Problem Relation Age of Onset   Coronary artery disease Mother    Cerebrovascular Accident Mother    Hyperlipidemia Mother    Hypertension Mother    Chronic Renal Failure Mother    Hypercholesterolemia Mother    Transient ischemic attack Father 6   Breast cancer Neg Hx      Social History   Socioeconomic History   Marital status: Married    Spouse name: Not on file   Number of children: 3   Years of education: 16   Highest education level: Not on file  Occupational History   Occupation: TEACHER  Tobacco Use   Smoking status: Never   Smokeless tobacco: Never  Vaping Use   Vaping Use: Never used  Substance and Sexual Activity   Alcohol use: No   Drug use: No   Sexual activity: Yes    Birth control/protection: Post-menopausal  Other Topics Concern   Not on file  Social History Narrative   Not on file   Social Determinants of Health   Financial Resource Strain: Not on file  Food Insecurity: Not on file  Transportation Needs: Not on file  Physical Activity: Not on file  Stress: Not on file  Social Connections: Not on file  Intimate Partner Violence: Not on file     Current Outpatient Medications:    aspirin 81 MG chewable tablet, Chew 81 mg by mouth daily., Disp: , Rfl:    Cholecalciferol (VITAMIN D3) 1000 units CAPS, Take 1 capsule by mouth daily., Disp: , Rfl:    Cyanocobalamin (VITAMIN B 12 PO), Take by mouth., Disp: , Rfl:    Multiple Vitamins-Minerals (ZINC PO), Take by mouth., Disp: , Rfl:  vitamin C (ASCORBIC ACID) 500 MG tablet, Take 500 mg by mouth daily., Disp: , Rfl:    ROS:  Review of Systems  Constitutional:  Negative for fatigue, fever and unexpected weight change.  Respiratory:  Negative for cough, shortness of breath and wheezing.   Cardiovascular:  Negative for chest pain, palpitations and leg swelling.  Gastrointestinal:  Negative for blood in stool, constipation, diarrhea, nausea and vomiting.  Endocrine: Negative for cold intolerance, heat intolerance and polyuria.  Genitourinary:  Negative for dyspareunia, dysuria, flank pain, frequency, genital sores, hematuria, menstrual problem, pelvic pain, urgency, vaginal bleeding, vaginal discharge and vaginal pain.  Musculoskeletal:  Negative for back pain, joint  swelling and myalgias.  Skin:  Negative for rash.  Neurological:  Negative for dizziness, syncope, light-headedness, numbness and headaches.  Hematological:  Negative for adenopathy.  Psychiatric/Behavioral:  Negative for agitation, confusion, sleep disturbance and suicidal ideas. The patient is not nervous/anxious.     Objective: BP 110/60   Ht 5' 6.5" (1.689 m)   Wt 150 lb (68 kg)   BMI 23.85 kg/m    Physical Exam Constitutional:      Appearance: She is well-developed.  Genitourinary:     Vulva normal.     Right Labia: No rash, tenderness or lesions.    Left Labia: No tenderness, lesions or rash.    No vaginal discharge, erythema or tenderness.      Right Adnexa: not tender and no mass present.    Left Adnexa: not tender and no mass present.    No cervical motion tenderness, friability or polyp.     Uterus is not enlarged or tender.  Breasts:    Right: No mass, nipple discharge, skin change or tenderness.     Left: No mass, nipple discharge, skin change or tenderness.  Neck:     Thyroid: No thyromegaly.  Cardiovascular:     Rate and Rhythm: Normal rate and regular rhythm.     Heart sounds: Normal heart sounds. No murmur heard. Pulmonary:     Effort: Pulmonary effort is normal.     Breath sounds: Normal breath sounds.  Abdominal:     Palpations: Abdomen is soft.     Tenderness: There is no abdominal tenderness. There is no guarding or rebound.  Musculoskeletal:        General: Normal range of motion.     Cervical back: Normal range of motion.  Lymphadenopathy:     Cervical: No cervical adenopathy.  Neurological:     General: No focal deficit present.     Mental Status: She is alert and oriented to person, place, and time.     Cranial Nerves: No cranial nerve deficit.  Skin:    General: Skin is warm and dry.  Psychiatric:        Mood and Affect: Mood normal.        Behavior: Behavior normal.        Thought Content: Thought content normal.        Judgment:  Judgment normal.  Vitals reviewed.    Assessment/Plan:  Encounter for annual routine gynecological examination  Cervical cancer screening - Plan: Cytology - PAP  Screening for HPV (human papillomavirus) - Plan: Cytology - PAP  Encounter for screening mammogram for malignant neoplasm of breast; pt has mammo appt  Screening for osteoporosis - Plan: DG Bone Density; pt to sched at [facility] with mammo  Need for immunization against influenza - Plan: Flu Vaccine QUAD 43mo+IM (Fluarix, Fluzone & Alfiuria Quad PF)  GYN counsel mammography screening, adequate intake of calcium and vitamin D, diet and exercise    F/U  Return in about 2 years (around 09/05/2023).  Sanai Frick B. Natahsa Marian, PA-C 09/04/2021 9:38 AM

## 2021-09-04 ENCOUNTER — Ambulatory Visit (INDEPENDENT_AMBULATORY_CARE_PROVIDER_SITE_OTHER): Payer: Medicare PPO | Admitting: Obstetrics and Gynecology

## 2021-09-04 ENCOUNTER — Encounter: Payer: Self-pay | Admitting: Obstetrics and Gynecology

## 2021-09-04 ENCOUNTER — Other Ambulatory Visit: Payer: Self-pay

## 2021-09-04 ENCOUNTER — Other Ambulatory Visit (HOSPITAL_COMMUNITY)
Admission: RE | Admit: 2021-09-04 | Discharge: 2021-09-04 | Disposition: A | Discharge status: Home / Self Care | Payer: Medicare PPO | Source: Ambulatory Visit | Attending: Obstetrics and Gynecology | Admitting: Obstetrics and Gynecology

## 2021-09-04 VITALS — BP 110/60 | Ht 66.5 in | Wt 150.0 lb

## 2021-09-04 DIAGNOSIS — Z13820 Encounter for screening for osteoporosis: Secondary | ICD-10-CM

## 2021-09-04 DIAGNOSIS — Z124 Encounter for screening for malignant neoplasm of cervix: Secondary | ICD-10-CM

## 2021-09-04 DIAGNOSIS — Z1151 Encounter for screening for human papillomavirus (HPV): Secondary | ICD-10-CM | POA: Diagnosis present

## 2021-09-04 DIAGNOSIS — Z23 Encounter for immunization: Secondary | ICD-10-CM | POA: Diagnosis not present

## 2021-09-04 DIAGNOSIS — Z01419 Encounter for gynecological examination (general) (routine) without abnormal findings: Secondary | ICD-10-CM | POA: Insufficient documentation

## 2021-09-04 DIAGNOSIS — Z1231 Encounter for screening mammogram for malignant neoplasm of breast: Secondary | ICD-10-CM | POA: Diagnosis not present

## 2021-09-04 NOTE — Patient Instructions (Signed)
I value your feedback and you entrusting us with your care. If you get a [facility] patient survey, I would appreciate you taking the time to let us know about your experience today. Thank you! ? ? ?

## 2021-09-11 LAB — CYTOLOGY - PAP
Comment: NEGATIVE
Diagnosis: NEGATIVE
High risk HPV: NEGATIVE

## 2021-09-25 ENCOUNTER — Ambulatory Visit
Admission: RE | Admit: 2021-09-25 | Discharge: 2021-09-25 | Disposition: A | Discharge status: Home / Self Care | Payer: Medicare PPO | Source: Ambulatory Visit | Attending: Internal Medicine | Admitting: Internal Medicine

## 2021-09-25 ENCOUNTER — Other Ambulatory Visit: Payer: Self-pay

## 2021-09-25 ENCOUNTER — Ambulatory Visit
Admission: RE | Admit: 2021-09-25 | Discharge: 2021-09-25 | Disposition: A | Discharge status: Home / Self Care | Payer: Medicare PPO | Source: Ambulatory Visit | Attending: Obstetrics and Gynecology | Admitting: Obstetrics and Gynecology

## 2021-09-25 DIAGNOSIS — Z13820 Encounter for screening for osteoporosis: Secondary | ICD-10-CM

## 2021-09-25 DIAGNOSIS — Z1231 Encounter for screening mammogram for malignant neoplasm of breast: Secondary | ICD-10-CM | POA: Insufficient documentation

## 2021-09-25 DIAGNOSIS — Z780 Asymptomatic menopausal state: Secondary | ICD-10-CM | POA: Insufficient documentation

## 2021-09-25 DIAGNOSIS — M8589 Other specified disorders of bone density and structure, multiple sites: Secondary | ICD-10-CM | POA: Insufficient documentation

## 2021-10-02 ENCOUNTER — Other Ambulatory Visit: Payer: BC Managed Care – PPO

## 2022-04-12 ENCOUNTER — Telehealth: Payer: Self-pay | Admitting: Radiology

## 2022-07-21 DIAGNOSIS — C182 Malignant neoplasm of ascending colon: Secondary | ICD-10-CM

## 2022-07-21 HISTORY — DX: Malignant neoplasm of ascending colon: C18.2

## 2022-08-05 HISTORY — PX: COLONOSCOPY: SHX174

## 2022-08-09 ENCOUNTER — Other Ambulatory Visit: Payer: Self-pay | Admitting: Internal Medicine

## 2022-08-09 DIAGNOSIS — C182 Malignant neoplasm of ascending colon: Secondary | ICD-10-CM

## 2022-08-12 ENCOUNTER — Ambulatory Visit
Admission: RE | Admit: 2022-08-12 | Discharge: 2022-08-12 | Disposition: A | Discharge status: Home / Self Care | Payer: Medicare PPO | Source: Ambulatory Visit | Attending: Internal Medicine | Admitting: Internal Medicine

## 2022-08-12 DIAGNOSIS — C182 Malignant neoplasm of ascending colon: Secondary | ICD-10-CM | POA: Insufficient documentation

## 2022-08-12 LAB — POCT I-STAT CREATININE: Creatinine, Ser: 0.7 mg/dL (ref 0.44–1.00)

## 2022-08-12 MED ORDER — IOHEXOL 300 MG/ML  SOLN
100.0000 mL | Freq: Once | INTRAMUSCULAR | Status: AC | PRN
  Administered 2022-08-12: 100 mL via INTRAVENOUS

## 2022-08-15 ENCOUNTER — Other Ambulatory Visit: Payer: Self-pay | Admitting: Internal Medicine

## 2022-08-15 DIAGNOSIS — C182 Malignant neoplasm of ascending colon: Secondary | ICD-10-CM

## 2022-08-16 ENCOUNTER — Ambulatory Visit: Payer: Self-pay | Admitting: General Surgery

## 2022-08-16 ENCOUNTER — Other Ambulatory Visit: Payer: Self-pay | Admitting: General Surgery

## 2022-08-16 ENCOUNTER — Other Ambulatory Visit: Payer: Medicare PPO

## 2022-08-16 DIAGNOSIS — C182 Malignant neoplasm of ascending colon: Secondary | ICD-10-CM

## 2022-08-16 NOTE — H&P (Signed)
PATIENT PROFILE: Brianna Dickerson is a 66 y.o. female who presents to the Clinic for consultation at the request of Dr. 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  PCP:  Brianna Pax, MD  HISTORY OF PRESENT ILLNESS: Brianna Dickerson reports she was getting her usual screening colonoscopy 5 years after the last colonoscopy due to presence of polyps.  5 years ago she was found with multiple polyps in the colon that were removed.  Negative for malignancy.  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  Biopsy shows moderately differentiated adenocarcinoma.  She denies any abdominal pain.  She denies rectal bleeding.  She denies weight loss.  She denies family history of cancer  Patient had a CT scan of the abdomen and pelvis that shows no sign of metastatic disease.  CT scan of the chest was ordered.  She also was found with incidental small mass in the right kidney.  There was indeterminate and MRI was recommended for further characterization.   PROBLEM LIST: Problem List  Date Reviewed: 11/29/2021          Noted   Medicare annual wellness visit, initial 05/11/2021   Overview    7/22, 8/23 Bone density 12/22, osteopenia, -2.4      Primary osteoarthritis of both hands 11/29/2020   Tubular adenoma 05/04/2018   Overview    9/18 x 1      B12 deficiency 04/29/2016   Overview    242, 7/17      History of Lyme disease 04/29/2016   Overview    5/17, erythema migranes, 1 month doxycycline      Fibrocystic disease of both breasts Unknown    GENERAL REVIEW OF SYSTEMS:   General ROS: negative for - chills, fatigue, fever, weight gain or weight loss Allergy and Immunology ROS: negative for - hives  Hematological and Lymphatic ROS: negative for - bleeding problems or bruising, negative for palpable nodes Endocrine ROS: negative for - heat or cold intolerance, hair changes Respiratory ROS: negative for - cough, shortness of breath or  wheezing Cardiovascular ROS: no chest pain or palpitations GI ROS: negative for nausea, vomiting, abdominal pain, diarrhea, constipation Musculoskeletal ROS: negative for - joint swelling or muscle pain Neurological ROS: negative for - confusion, syncope Dermatological ROS: negative for pruritus and rash Psychiatric: negative for anxiety, depression, difficulty sleeping and memory loss  MEDICATIONS: Current Outpatient Medications  Medication Sig Dispense Refill   acyclovir (ZOVIRAX) 5 % cream Apply 1 Application! topically 5 (five) times daily Per patient     ascorbic acid, vitamin C, (VITAMIN C) 1000 MG tablet Take 1,000 mg by mouth once daily.     aspirin 81 MG EC tablet Take 81 mg by mouth once daily.     calcium carbonate (CALCIUM 600 ORAL) Take by mouth 2 (two) times daily     cholecalciferol (VITAMIN D3) 1000 unit tablet Take 1,000 Units by mouth once daily        CYANOCOBALAMIN, VITAMIN B-12, ORAL Take 1,000 mcg by mouth once daily Liquid sub lingual       Herbal Supplement Herbal Name: zinc gummies       predniSONE (DELTASONE) 50 MG tablet Take as directed 3 tablet 0   metroNIDAZOLE (FLAGYL) 500 MG tablet Take 2 tablets at 2 pm, 3 pm and 10 pm the day before surgery. 6 tablet 0   neomycin 500 mg tablet Take 2 tablets at 2 pm, 3 pm and  10 pm the day before surgery. 6 tablet 0   No current facility-administered medications for this visit.    ALLERGIES: Shellfish containing products and Shrimp  PAST MEDICAL HISTORY: Past Medical History:  Diagnosis Date   B12 deficiency 04/29/2016   242, 7/17   Fibrocystic disease of both breasts    History of Lyme disease 04/29/2016   5/17, erythema migranes, 1 month doxycycline   Polyarthralgia     PAST SURGICAL HISTORY: Past Surgical History:  Procedure Laterality Date   COLONOSCOPY  04/10/2007   Dr. Kurtis Dickerson @ [facility] - Diverticulosis, FHPolyps(s)   COLONOSCOPY  08/15/2017   Tubular adenoma of the colon/Repeat 29yr/TKT   CESAREAN  SECTION     with childbirth 07/1985   VBAC X 2  1988 and 1990     FAMILY HISTORY: Family History  Problem Relation Age of Onset   Peripheral vascular disease Other        family hx of   Peripheral vascular disease Mother    Myocardial Infarction (Heart attack) Mother    Rheum arthritis Mother    Myocardial Infarction (Heart attack) Paternal Grandfather    Heart disease Paternal Grandfather      SOCIAL HISTORY: Social History   Socioeconomic History   Marital status: Married  Occupational History   Occupation: TPharmacist, 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    PHYSICAL EXAM: Vitals:   08/16/22 0732  BP: 105/63  Pulse: 71   Body mass index is 24.79 kg/m. Weight: 67.6 kg (149 lb)   GENERAL: Alert, active, oriented x3  HEENT: Pupils equal reactive to light. Extraocular movements are intact. Sclera clear. Palpebral conjunctiva normal red color.Pharynx clear.  NECK: Supple with no palpable mass and no adenopathy.  LUNGS: Sound clear with no rales rhonchi or wheezes.  HEART: Regular rhythm S1 and S2 without murmur.  ABDOMEN: Soft and depressible, nontender with no palpable mass, no hepatomegaly.   EXTREMITIES: Well-developed well-nourished symmetrical with no dependent edema.  NEUROLOGICAL: Awake alert oriented, facial expression symmetrical, moving all extremities.  REVIEW OF DATA: I have reviewed the following data today: Appointment on 07/05/2022  Component Date Value   Ferritin 07/05/2022 16    Hemoglobin 07/05/2022 12.0   Appointment on 05/24/2022  Component Date Value   WBC (White Blood Cell Co* 05/24/2022 4.5    RBC (Red Blood Cell Coun* 05/24/2022 4.14     Hemoglobin 05/24/2022 11.7 (L)    Hematocrit 05/24/2022 36.2    MCV (Mean Corpuscular Vo* 05/24/2022 87.4    MCH (Mean Corpuscular He* 05/24/2022 28.3    MCHC (Mean Corpuscular H* 05/24/2022 32.3    Platelet Count 05/24/2022 208    RDW-CV (Red Cell Distrib* 05/24/2022 13.0    MPV (Mean Platelet Volum* 05/24/2022 10.5    Neutrophils 05/24/2022 2.46    Lymphocytes 05/24/2022 1.27    Monocytes 05/24/2022 0.48    Eosinophils 05/24/2022 0.27    Basophils 05/24/2022 0.05    Neutrophil % 05/24/2022 54.3    Lymphocyte % 05/24/2022 28.0    Monocyte % 05/24/2022 10.6    Eosinophil % 05/24/2022 6.0 (H)    Basophil% 05/24/2022 1.1  Immature Granulocyte % 05/24/2022 0.0    Immature Granulocyte Cou* 05/24/2022 0.00    Glucose 05/24/2022 85    Sodium 05/24/2022 139    Potassium 05/24/2022 4.4    Chloride 05/24/2022 106    Carbon Dioxide (CO2) 05/24/2022 30.0    Urea Nitrogen (BUN) 05/24/2022 25    Creatinine 05/24/2022 0.9    Glomerular Filtration Ra* 05/24/2022 63    Calcium 05/24/2022 9.3    AST  05/24/2022 13    ALT  05/24/2022 10    Alk Phos (alkaline Phosp* 05/24/2022 48    Albumin 05/24/2022 4.1    Bilirubin, Total 05/24/2022 0.7    Protein, Total 05/24/2022 6.4    A/G Ratio 05/24/2022 1.8    Cholesterol, Total 05/24/2022 177    Triglyceride 05/24/2022 48    HDL (High Density Lipopr* 05/24/2022 72.1    LDL Calculated 05/24/2022 95    VLDL Cholesterol 05/24/2022 10    Cholesterol/HDL Ratio 05/24/2022 2.5    Magnesium 05/24/2022 1.9    Vitamin B12 05/24/2022 >1,500    Color 05/24/2022 Yellow    Clarity 05/24/2022 Clear    Specific Gravity 05/24/2022 1.020    pH, Urine 05/24/2022 6.5    Protein, Urinalysis 05/24/2022 Negative    Glucose, Urinalysis 05/24/2022 Negative    Ketones, Urinalysis 05/24/2022 Negative    Blood, Urinalysis 05/24/2022 Negative    Nitrite, Urinalysis 05/24/2022 Negative    Leukocyte Esterase, Urin* 05/24/2022 Negative    White Blood Cells, Urina*  05/24/2022 None Seen    Red Blood Cells, Urinaly* 05/24/2022 None Seen    Bacteria, Urinalysis 05/24/2022 Rare (!)    Squamous Epithelial Cell* 05/24/2022 None Seen    Thyroid Stimulating Horm* 05/24/2022 1.827    Vitamin D, 25-Hydroxy - * 05/24/2022 68.8      ASSESSMENT: Ms. Gwinn is a 66 y.o. female presenting for consultation for malignancy of the ascending colon.  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  Preoperative work-up shows no sign of metastatic disease to the abdominal cavity.  CT scan of the chest is ordered.  She was found with an incidental mass at the right kidney.  MRI was ordered for further characterization.  Patient was oriented about the surgical management for ileocecal valve mass.  She was oriented about the recommendation of right hemicolectomy.  We discussed about the oncologic resection to include lymph nodes.  She was oriented about the expected recovery.  We discussed about the risk of surgery include bleeding, infection, anastomosis leak, intestinal obstruction, injury to adjacent organ, among others.  The patient reports she understood and agreed with plan.  CEA levels were also ordered.  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   Patient and her husband verbalized understanding, all questions were answered, and were agreeable with the plan outlined above.     Herbert Pun, MD  Electronically signed by Herbert Pun, MD

## 2022-08-16 NOTE — H&P (View-Only) (Signed)
PATIENT PROFILE: BRITTANE GRUDZINSKI is a 66 y.o. female who presents to the Clinic for consultation at the request of Dr. 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  PCP:  Yevonne Pax, MD  HISTORY OF PRESENT ILLNESS: Ms. Brame reports she was getting her usual screening colonoscopy 5 years after the last colonoscopy due to presence of polyps.  5 years ago she was found with multiple polyps in the colon that were removed.  Negative for malignancy.  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  Biopsy shows moderately differentiated adenocarcinoma.  She denies any abdominal pain.  She denies rectal bleeding.  She denies weight loss.  She denies family history of cancer  Patient had a CT scan of the abdomen and pelvis that shows no sign of metastatic disease.  CT scan of the chest was ordered.  She also was found with incidental small mass in the right kidney.  There was indeterminate and MRI was recommended for further characterization.   PROBLEM LIST: Problem List  Date Reviewed: 11/29/2021          Noted   Medicare annual wellness visit, initial 05/11/2021   Overview    7/22, 8/23 Bone density 12/22, osteopenia, -2.4      Primary osteoarthritis of both hands 11/29/2020   Tubular adenoma 05/04/2018   Overview    9/18 x 1      B12 deficiency 04/29/2016   Overview    242, 7/17      History of Lyme disease 04/29/2016   Overview    5/17, erythema migranes, 1 month doxycycline      Fibrocystic disease of both breasts Unknown    GENERAL REVIEW OF SYSTEMS:   General ROS: negative for - chills, fatigue, fever, weight gain or weight loss Allergy and Immunology ROS: negative for - hives  Hematological and Lymphatic ROS: negative for - bleeding problems or bruising, negative for palpable nodes Endocrine ROS: negative for - heat or cold intolerance, hair changes Respiratory ROS: negative for - cough, shortness of breath or  wheezing Cardiovascular ROS: no chest pain or palpitations GI ROS: negative for nausea, vomiting, abdominal pain, diarrhea, constipation Musculoskeletal ROS: negative for - joint swelling or muscle pain Neurological ROS: negative for - confusion, syncope Dermatological ROS: negative for pruritus and rash Psychiatric: negative for anxiety, depression, difficulty sleeping and memory loss  MEDICATIONS: Current Outpatient Medications  Medication Sig Dispense Refill   acyclovir (ZOVIRAX) 5 % cream Apply 1 Application! topically 5 (five) times daily Per patient     ascorbic acid, vitamin C, (VITAMIN C) 1000 MG tablet Take 1,000 mg by mouth once daily.     aspirin 81 MG EC tablet Take 81 mg by mouth once daily.     calcium carbonate (CALCIUM 600 ORAL) Take by mouth 2 (two) times daily     cholecalciferol (VITAMIN D3) 1000 unit tablet Take 1,000 Units by mouth once daily        CYANOCOBALAMIN, VITAMIN B-12, ORAL Take 1,000 mcg by mouth once daily Liquid sub lingual       Herbal Supplement Herbal Name: zinc gummies       predniSONE (DELTASONE) 50 MG tablet Take as directed 3 tablet 0   metroNIDAZOLE (FLAGYL) 500 MG tablet Take 2 tablets at 2 pm, 3 pm and 10 pm the day before surgery. 6 tablet 0   neomycin 500 mg tablet Take 2 tablets at 2 pm, 3 pm and  10 pm the day before surgery. 6 tablet 0   No current facility-administered medications for this visit.    ALLERGIES: Shellfish containing products and Shrimp  PAST MEDICAL HISTORY: Past Medical History:  Diagnosis Date   B12 deficiency 04/29/2016   242, 7/17   Fibrocystic disease of both breasts    History of Lyme disease 04/29/2016   5/17, erythema migranes, 1 month doxycycline   Polyarthralgia     PAST SURGICAL HISTORY: Past Surgical History:  Procedure Laterality Date   COLONOSCOPY  04/10/2007   Dr. Kurtis Bushman @ [facility] - Diverticulosis, FHPolyps(s)   COLONOSCOPY  08/15/2017   Tubular adenoma of the colon/Repeat 3yr/TKT   CESAREAN  SECTION     with childbirth 07/1985   VBAC X 2  1988 and 1990     FAMILY HISTORY: Family History  Problem Relation Age of Onset   Peripheral vascular disease Other        family hx of   Peripheral vascular disease Mother    Myocardial Infarction (Heart attack) Mother    Rheum arthritis Mother    Myocardial Infarction (Heart attack) Paternal Grandfather    Heart disease Paternal Grandfather      SOCIAL HISTORY: Social History   Socioeconomic History   Marital status: Married  Occupational History   Occupation: TPharmacist, 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    PHYSICAL EXAM: Vitals:   08/16/22 0732  BP: 105/63  Pulse: 71   Body mass index is 24.79 kg/m. Weight: 67.6 kg (149 lb)   GENERAL: Alert, active, oriented x3  HEENT: Pupils equal reactive to light. Extraocular movements are intact. Sclera clear. Palpebral conjunctiva normal red color.Pharynx clear.  NECK: Supple with no palpable mass and no adenopathy.  LUNGS: Sound clear with no rales rhonchi or wheezes.  HEART: Regular rhythm S1 and S2 without murmur.  ABDOMEN: Soft and depressible, nontender with no palpable mass, no hepatomegaly.   EXTREMITIES: Well-developed well-nourished symmetrical with no dependent edema.  NEUROLOGICAL: Awake alert oriented, facial expression symmetrical, moving all extremities.  REVIEW OF DATA: I have reviewed the following data today: Appointment on 07/05/2022  Component Date Value   Ferritin 07/05/2022 16    Hemoglobin 07/05/2022 12.0   Appointment on 05/24/2022  Component Date Value   WBC (White Blood Cell Co* 05/24/2022 4.5    RBC (Red Blood Cell Coun* 05/24/2022 4.14     Hemoglobin 05/24/2022 11.7 (L)    Hematocrit 05/24/2022 36.2    MCV (Mean Corpuscular Vo* 05/24/2022 87.4    MCH (Mean Corpuscular He* 05/24/2022 28.3    MCHC (Mean Corpuscular H* 05/24/2022 32.3    Platelet Count 05/24/2022 208    RDW-CV (Red Cell Distrib* 05/24/2022 13.0    MPV (Mean Platelet Volum* 05/24/2022 10.5    Neutrophils 05/24/2022 2.46    Lymphocytes 05/24/2022 1.27    Monocytes 05/24/2022 0.48    Eosinophils 05/24/2022 0.27    Basophils 05/24/2022 0.05    Neutrophil % 05/24/2022 54.3    Lymphocyte % 05/24/2022 28.0    Monocyte % 05/24/2022 10.6    Eosinophil % 05/24/2022 6.0 (H)    Basophil% 05/24/2022 1.1  Immature Granulocyte % 05/24/2022 0.0    Immature Granulocyte Cou* 05/24/2022 0.00    Glucose 05/24/2022 85    Sodium 05/24/2022 139    Potassium 05/24/2022 4.4    Chloride 05/24/2022 106    Carbon Dioxide (CO2) 05/24/2022 30.0    Urea Nitrogen (BUN) 05/24/2022 25    Creatinine 05/24/2022 0.9    Glomerular Filtration Ra* 05/24/2022 63    Calcium 05/24/2022 9.3    AST  05/24/2022 13    ALT  05/24/2022 10    Alk Phos (alkaline Phosp* 05/24/2022 48    Albumin 05/24/2022 4.1    Bilirubin, Total 05/24/2022 0.7    Protein, Total 05/24/2022 6.4    A/G Ratio 05/24/2022 1.8    Cholesterol, Total 05/24/2022 177    Triglyceride 05/24/2022 48    HDL (High Density Lipopr* 05/24/2022 72.1    LDL Calculated 05/24/2022 95    VLDL Cholesterol 05/24/2022 10    Cholesterol/HDL Ratio 05/24/2022 2.5    Magnesium 05/24/2022 1.9    Vitamin B12 05/24/2022 >1,500    Color 05/24/2022 Yellow    Clarity 05/24/2022 Clear    Specific Gravity 05/24/2022 1.020    pH, Urine 05/24/2022 6.5    Protein, Urinalysis 05/24/2022 Negative    Glucose, Urinalysis 05/24/2022 Negative    Ketones, Urinalysis 05/24/2022 Negative    Blood, Urinalysis 05/24/2022 Negative    Nitrite, Urinalysis 05/24/2022 Negative    Leukocyte Esterase, Urin* 05/24/2022 Negative    White Blood Cells, Urina*  05/24/2022 None Seen    Red Blood Cells, Urinaly* 05/24/2022 None Seen    Bacteria, Urinalysis 05/24/2022 Rare (!)    Squamous Epithelial Cell* 05/24/2022 None Seen    Thyroid Stimulating Horm* 05/24/2022 1.827    Vitamin D, 25-Hydroxy - * 05/24/2022 68.8      ASSESSMENT: Ms. Schwenn is a 66 y.o. female presenting for consultation for malignancy of the ascending colon.  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  Preoperative work-up shows no sign of metastatic disease to the abdominal cavity.  CT scan of the chest is ordered.  She was found with an incidental mass at the right kidney.  MRI was ordered for further characterization.  Patient was oriented about the surgical management for ileocecal valve mass.  She was oriented about the recommendation of right hemicolectomy.  We discussed about the oncologic resection to include lymph nodes.  She was oriented about the expected recovery.  We discussed about the risk of surgery include bleeding, infection, anastomosis leak, intestinal obstruction, injury to adjacent organ, among others.  The patient reports she understood and agreed with plan.  CEA levels were also ordered.  Malignant neoplasm of ascending colon (CMS-HCC) [C18.2]  PLAN: Robotic assisted laparoscopic right hemicolectomy (18335) CBC, CMP, CEA levels Take bowel prep the day before surgery as instructed Take antibiotics the day before surgery as prescribed Avoid taking aspirin 5 days before surgery Contact us if you have any concern.   Patient and her husband verbalized understanding, all questions were answered, and were agreeable with the plan outlined above.     Herbert Pun, MD  Electronically signed by Herbert Pun, MD

## 2022-08-19 ENCOUNTER — Inpatient Hospital Stay
Admission: RE | Admit: 2022-08-19 | Discharge: 2022-08-19 | Disposition: A | Discharge status: Home / Self Care | Payer: Medicare PPO | Source: Ambulatory Visit | Attending: Internal Medicine | Admitting: Internal Medicine

## 2022-08-19 ENCOUNTER — Inpatient Hospital Stay
Admission: RE | Admit: 2022-08-19 | Discharge: 2022-08-19 | Disposition: A | Discharge status: Home / Self Care | Payer: Medicare PPO | Source: Ambulatory Visit

## 2022-08-19 DIAGNOSIS — C182 Malignant neoplasm of ascending colon: Secondary | ICD-10-CM | POA: Insufficient documentation

## 2022-08-19 MED ORDER — IOHEXOL 300 MG/ML  SOLN
75.0000 mL | Freq: Once | INTRAMUSCULAR | Status: AC | PRN
  Administered 2022-08-19: 75 mL via INTRAVENOUS

## 2022-08-19 MED ORDER — GADOBUTROL 1 MMOL/ML IV SOLN
6.0000 mL | Freq: Once | INTRAVENOUS | Status: AC | PRN
  Administered 2022-08-19: 6 mL via INTRAVENOUS

## 2022-08-20 ENCOUNTER — Encounter
Admission: RE | Admit: 2022-08-20 | Discharge: 2022-08-20 | Disposition: A | Discharge status: Home / Self Care | Payer: Medicare PPO | Source: Ambulatory Visit | Attending: General Surgery | Admitting: General Surgery

## 2022-08-20 VITALS — Ht 66.0 in | Wt 150.0 lb

## 2022-08-20 DIAGNOSIS — I341 Nonrheumatic mitral (valve) prolapse: Secondary | ICD-10-CM

## 2022-08-20 DIAGNOSIS — Z01818 Encounter for other preprocedural examination: Secondary | ICD-10-CM | POA: Insufficient documentation

## 2022-08-20 DIAGNOSIS — Z01812 Encounter for preprocedural laboratory examination: Secondary | ICD-10-CM

## 2022-08-20 HISTORY — DX: Deficiency of other specified B group vitamins: E53.8

## 2022-08-20 HISTORY — DX: Diffuse cystic mastopathy of right breast: N60.11

## 2022-08-20 HISTORY — DX: Anemia, unspecified: D64.9

## 2022-08-20 LAB — TYPE AND SCREEN
ABO/RH(D): O POS
Antibody Screen: NEGATIVE

## 2022-08-20 NOTE — Patient Instructions (Addendum)
Your procedure is scheduled on: Wednesday, November 1 Report to the Registration Desk on the 1st floor of the [facility]. To find out your arrival time, please call [phone] between 1PM - 3PM on: Tuesday, October 31 If your arrival time is 6:00 am, do not arrive prior to that time as the [facility] entrance doors do not open until 6:00 am.  REMEMBER: Instructions that are not followed completely may result in serious medical risk, up to and including death; or upon the discretion of your surgeon and anesthesiologist your surgery may need to be rescheduled.  Do not eat or drink after midnight the night before surgery.  No gum chewing, lozengers or hard candies.  Take bowel prep the day before surgery as instructed Take antibiotics the day before surgery as prescribed Avoid taking aspirin 5 days before surgery  Do not take any medications the morning of surgery.  One week prior to surgery: starting today Stop Anti-inflammatories (NSAIDS) such as Advil, Aleve, Ibuprofen, Motrin, Naproxen, Naprosyn and Aspirin based products such as Excedrin, Goodys Powder, BC Powder. Stop ANY OVER THE COUNTER supplements until after surgery. Stop calcium, vitamin D, emergen C, multiple vitamins. You may however, continue to take Tylenol if needed for pain up until the day of surgery.  No Alcohol for 24 hours before or after surgery.  No Smoking including e-cigarettes for 24 hours prior to surgery.  No chewable tobacco products for at least 6 hours prior to surgery.  No nicotine patches on the day of surgery.  Do not use any "recreational" drugs for at least a week prior to your surgery.  Please be advised that the combination of cocaine and anesthesia may have negative outcomes, up to and including death. If you test positive for cocaine, your surgery will be cancelled.  On the morning of surgery brush your teeth with toothpaste and water, you may rinse your mouth with mouthwash if you  wish. Do not swallow any toothpaste or mouthwash.  Use CHG Soap as directed on instruction sheet.  Do not wear jewelry, make-up, hairpins, clips or nail polish.  Do not wear lotions, powders, or perfumes.   Do not shave body from the neck down 48 hours prior to surgery just in case you cut yourself which could leave a site for infection.  Also, freshly shaved skin may become irritated if using the CHG soap.  Contact lenses, hearing aids and dentures may not be worn into surgery.  Do not bring valuables to the hospital. [facility] is not responsible for any missing/lost belongings or valuables.   Notify your doctor if there is any change in your medical condition (cold, fever, infection).  Wear comfortable clothing (specific to your surgery type) to the hospital.  After surgery, you can help prevent lung complications by doing breathing exercises.  Take deep breaths and cough every 1-2 hours. Your doctor may order a device called an Incentive Spirometer to help you take deep breaths. When coughing or sneezing, hold a pillow firmly against your incision with both hands. This is called "splinting." Doing this helps protect your incision. It also decreases belly discomfort.  If you are being admitted to the hospital overnight, leave your suitcase in the car. After surgery it may be brought to your room.  Please call the [facility] Dept. at [phone] if you have any questions about these instructions.  Surgery Visitation Policy:  Patients undergoing a surgery or procedure may have two family members or support persons with  them as long as the person is not COVID-19 positive or experiencing its symptoms.   Inpatient Visitation:    Visiting hours are 7 a.m. to 8 p.m. Up to four visitors are allowed at one time in a patient room, including children. The visitors may rotate out with other people during the day. One designated support person (adult) may remain overnight.       Preparing for Surgery with CHLORHEXIDINE GLUCONATE (CHG) Soap  Chlorhexidine Gluconate (CHG) Soap  o An antiseptic cleaner that kills germs and bonds with the skin to continue killing germs even after washing  o Used for showering the night before surgery and morning of surgery  Before surgery, you can play an important role by reducing the number of germs on your skin.  CHG (Chlorhexidine gluconate) soap is an antiseptic cleanser which kills germs and bonds with the skin to continue killing germs even after washing.  Please do not use if you have an allergy to CHG or antibacterial soaps. If your skin becomes reddened/irritated stop using the CHG.  1. Shower the NIGHT BEFORE SURGERY and the MORNING OF SURGERY with CHG soap.  2. If you choose to wash your hair, wash your hair first as usual with your normal shampoo.  3. After shampooing, rinse your hair and body thoroughly to remove the shampoo.  4. Use CHG as you would any other liquid soap. You can apply CHG directly to the skin and wash gently with a scrungie or a clean washcloth.  5. Apply the CHG soap to your body only from the neck down. Do not use on open wounds or open sores. Avoid contact with your eyes, ears, mouth, and genitals (private parts). Wash face and genitals (private parts) with your normal soap.  6. Wash thoroughly, paying special attention to the area where your surgery will be performed.  7. Thoroughly rinse your body with warm water.  8. Do not shower/wash with your normal soap after using and rinsing off the CHG soap.  9. Pat yourself dry with a clean towel.  10. Wear clean pajamas to bed the night before surgery.  12. Place clean sheets on your bed the night of your first shower and do not sleep with pets.  13. Shower again with the CHG soap on the day of surgery prior to arriving at the hospital.  14. Do not apply any deodorants/lotions/powders.  15. Please wear clean clothes to the hospital.

## 2022-08-21 ENCOUNTER — Encounter
Admission: RE | Disposition: A | Discharge status: Home / Self Care | Payer: Self-pay | Source: Home / Self Care | Attending: General Surgery

## 2022-08-21 ENCOUNTER — Other Ambulatory Visit: Payer: Self-pay

## 2022-08-21 ENCOUNTER — Inpatient Hospital Stay: Payer: Medicare PPO | Admitting: Anesthesiology

## 2022-08-21 ENCOUNTER — Encounter: Payer: Self-pay | Admitting: General Surgery

## 2022-08-21 ENCOUNTER — Inpatient Hospital Stay
Admission: RE | Admit: 2022-08-21 | Discharge: 2022-08-24 | DRG: 331 | Disposition: A | Discharge status: Home / Self Care | Payer: Medicare PPO | Attending: General Surgery | Admitting: General Surgery

## 2022-08-21 DIAGNOSIS — I341 Nonrheumatic mitral (valve) prolapse: Secondary | ICD-10-CM | POA: Diagnosis present

## 2022-08-21 DIAGNOSIS — M19041 Primary osteoarthritis, right hand: Secondary | ICD-10-CM | POA: Diagnosis present

## 2022-08-21 DIAGNOSIS — Z7952 Long term (current) use of systemic steroids: Secondary | ICD-10-CM | POA: Diagnosis not present

## 2022-08-21 DIAGNOSIS — Z8619 Personal history of other infectious and parasitic diseases: Secondary | ICD-10-CM

## 2022-08-21 DIAGNOSIS — M8580 Other specified disorders of bone density and structure, unspecified site: Secondary | ICD-10-CM | POA: Diagnosis present

## 2022-08-21 DIAGNOSIS — Z91013 Allergy to seafood: Secondary | ICD-10-CM

## 2022-08-21 DIAGNOSIS — Z79899 Other long term (current) drug therapy: Secondary | ICD-10-CM | POA: Diagnosis not present

## 2022-08-21 DIAGNOSIS — Z8249 Family history of ischemic heart disease and other diseases of the circulatory system: Secondary | ICD-10-CM | POA: Diagnosis not present

## 2022-08-21 DIAGNOSIS — Z01818 Encounter for other preprocedural examination: Secondary | ICD-10-CM

## 2022-08-21 DIAGNOSIS — Z8261 Family history of arthritis: Secondary | ICD-10-CM | POA: Diagnosis not present

## 2022-08-21 DIAGNOSIS — C182 Malignant neoplasm of ascending colon: Secondary | ICD-10-CM | POA: Diagnosis present

## 2022-08-21 DIAGNOSIS — M19042 Primary osteoarthritis, left hand: Secondary | ICD-10-CM | POA: Diagnosis present

## 2022-08-21 DIAGNOSIS — Z7982 Long term (current) use of aspirin: Secondary | ICD-10-CM

## 2022-08-21 DIAGNOSIS — C189 Malignant neoplasm of colon, unspecified: Principal | ICD-10-CM | POA: Diagnosis present

## 2022-08-21 LAB — ABO/RH: ABO/RH(D): O POS

## 2022-08-21 SURGERY — COLECTOMY, PARTIAL, ROBOT-ASSISTED, LAPAROSCOPIC
Anesthesia: General | Site: Abdomen | Laterality: Right

## 2022-08-21 MED ORDER — OXYCODONE HCL 5 MG/5ML PO SOLN: 5.0000 mg | Freq: Once | ORAL | Status: DC | PRN

## 2022-08-21 MED ORDER — GABAPENTIN 300 MG PO CAPS: 300.0000 mg | ORAL_CAPSULE | ORAL | Status: AC

## 2022-08-21 MED ORDER — HYDROMORPHONE HCL 1 MG/ML IJ SOLN
INTRAMUSCULAR | Status: DC | PRN
  Administered 2022-08-21: .5 mg via INTRAVENOUS

## 2022-08-21 MED ORDER — ONDANSETRON 4 MG PO TBDP: 4.0000 mg | ORAL_TABLET | Freq: Four times a day (QID) | ORAL | Status: DC | PRN

## 2022-08-21 MED ORDER — GABAPENTIN 300 MG PO CAPS
ORAL_CAPSULE | ORAL | Status: AC
  Administered 2022-08-21: 300 mg via ORAL
  Filled 2022-08-21: qty 1

## 2022-08-21 MED ORDER — PROPOFOL 500 MG/50ML IV EMUL
INTRAVENOUS | Status: DC | PRN
  Administered 2022-08-21: 50 ug/kg/min via INTRAVENOUS

## 2022-08-21 MED ORDER — MIDAZOLAM HCL 2 MG/2ML IJ SOLN
INTRAMUSCULAR | Status: DC | PRN
  Administered 2022-08-21: 2 mg via INTRAVENOUS

## 2022-08-21 MED ORDER — MIDAZOLAM HCL 2 MG/2ML IJ SOLN
INTRAMUSCULAR | Status: AC
  Filled 2022-08-21: qty 2

## 2022-08-21 MED ORDER — BUPIVACAINE HCL (PF) 0.5 % IJ SOLN
INTRAMUSCULAR | Status: DC | PRN
  Administered 2022-08-21: 30 mL

## 2022-08-21 MED ORDER — ACETAMINOPHEN 500 MG PO TABS: 1000.0000 mg | ORAL_TABLET | ORAL | Status: AC

## 2022-08-21 MED ORDER — SODIUM CHLORIDE 0.9 % IV SOLN
INTRAVENOUS | Status: AC
  Filled 2022-08-21: qty 2

## 2022-08-21 MED ORDER — CELECOXIB 200 MG PO CAPS: 200.0000 mg | ORAL_CAPSULE | ORAL | Status: AC

## 2022-08-21 MED ORDER — ONDANSETRON HCL 4 MG/2ML IJ SOLN
INTRAMUSCULAR | Status: DC | PRN
  Administered 2022-08-21: 4 mg via INTRAVENOUS

## 2022-08-21 MED ORDER — ROCURONIUM BROMIDE 100 MG/10ML IV SOLN
INTRAVENOUS | Status: DC | PRN
  Administered 2022-08-21 (×2): 10 mg via INTRAVENOUS
  Administered 2022-08-21: 40 mg via INTRAVENOUS
  Administered 2022-08-21: 20 mg via INTRAVENOUS

## 2022-08-21 MED ORDER — BUPIVACAINE LIPOSOME 1.3 % IJ SUSP
INTRAMUSCULAR | Status: DC | PRN
  Administered 2022-08-21: 20 mL

## 2022-08-21 MED ORDER — MORPHINE SULFATE (PF) 4 MG/ML IV SOLN: 4.0000 mg | INTRAVENOUS | Status: DC | PRN

## 2022-08-21 MED ORDER — BUPIVACAINE LIPOSOME 1.3 % IJ SUSP
INTRAMUSCULAR | Status: AC
  Filled 2022-08-21: qty 20

## 2022-08-21 MED ORDER — CHLORHEXIDINE GLUCONATE 0.12 % MT SOLN
OROMUCOSAL | Status: AC
  Administered 2022-08-21: 15 mL via OROMUCOSAL
  Filled 2022-08-21: qty 15

## 2022-08-21 MED ORDER — HYDROMORPHONE HCL 1 MG/ML IJ SOLN
0.2500 mg | INTRAMUSCULAR | Status: DC | PRN
  Administered 2022-08-21 (×3): 0.25 mg via INTRAVENOUS

## 2022-08-21 MED ORDER — PHENYLEPHRINE HCL (PRESSORS) 10 MG/ML IV SOLN
INTRAVENOUS | Status: DC | PRN
  Administered 2022-08-21 (×3): 80 ug via INTRAVENOUS

## 2022-08-21 MED ORDER — FENTANYL CITRATE (PF) 100 MCG/2ML IJ SOLN
INTRAMUSCULAR | Status: DC | PRN
  Administered 2022-08-21 (×2): 50 ug via INTRAVENOUS

## 2022-08-21 MED ORDER — HEPARIN SODIUM (PORCINE) 5000 UNIT/ML IJ SOLN
INTRAMUSCULAR | Status: AC
  Administered 2022-08-21: 5000 [IU] via SUBCUTANEOUS
  Filled 2022-08-21: qty 1

## 2022-08-21 MED ORDER — OXYCODONE HCL 5 MG PO TABS: 5.0000 mg | ORAL_TABLET | Freq: Once | ORAL | Status: DC | PRN

## 2022-08-21 MED ORDER — BUPIVACAINE LIPOSOME 1.3 % IJ SUSP: 20.0000 mL | Freq: Once | INTRAMUSCULAR | Status: DC

## 2022-08-21 MED ORDER — ENOXAPARIN SODIUM 40 MG/0.4ML IJ SOSY
40.0000 mg | PREFILLED_SYRINGE | INTRAMUSCULAR | Status: DC
  Administered 2022-08-22 – 2022-08-23 (×2): 40 mg via SUBCUTANEOUS
  Filled 2022-08-21 (×2): qty 0.4

## 2022-08-21 MED ORDER — ALVIMOPAN 12 MG PO CAPS
12.0000 mg | ORAL_CAPSULE | Freq: Two times a day (BID) | ORAL | Status: DC
  Administered 2022-08-22: 12 mg via ORAL
  Filled 2022-08-21 (×2): qty 1

## 2022-08-21 MED ORDER — HYDROMORPHONE HCL 1 MG/ML IJ SOLN
INTRAMUSCULAR | Status: AC
  Filled 2022-08-21: qty 1

## 2022-08-21 MED ORDER — ONDANSETRON HCL 4 MG/2ML IJ SOLN: 4.0000 mg | Freq: Four times a day (QID) | INTRAMUSCULAR | Status: DC | PRN

## 2022-08-21 MED ORDER — SCOPOLAMINE 1 MG/3DAYS TD PT72
MEDICATED_PATCH | TRANSDERMAL | Status: AC
  Filled 2022-08-21: qty 1

## 2022-08-21 MED ORDER — INDOCYANINE GREEN 25 MG IV SOLR
INTRAVENOUS | Status: DC | PRN
  Administered 2022-08-21 (×2): 5 mg via INTRAVENOUS

## 2022-08-21 MED ORDER — PROPOFOL 10 MG/ML IV BOLUS
INTRAVENOUS | Status: AC
  Filled 2022-08-21: qty 40

## 2022-08-21 MED ORDER — PROPOFOL 1000 MG/100ML IV EMUL
INTRAVENOUS | Status: AC
  Filled 2022-08-21: qty 100

## 2022-08-21 MED ORDER — HEPARIN SODIUM (PORCINE) 5000 UNIT/ML IJ SOLN: 5000.0000 [IU] | Freq: Once | INTRAMUSCULAR | Status: AC

## 2022-08-21 MED ORDER — PROPOFOL 10 MG/ML IV BOLUS
INTRAVENOUS | Status: DC | PRN
  Administered 2022-08-21: 160 mg via INTRAVENOUS

## 2022-08-21 MED ORDER — DEXAMETHASONE SODIUM PHOSPHATE 10 MG/ML IJ SOLN
INTRAMUSCULAR | Status: DC | PRN
  Administered 2022-08-21: 10 mg via INTRAVENOUS

## 2022-08-21 MED ORDER — DIPHENHYDRAMINE HCL 50 MG/ML IJ SOLN
INTRAMUSCULAR | Status: DC | PRN
  Administered 2022-08-21: 12.5 mg via INTRAVENOUS

## 2022-08-21 MED ORDER — CELECOXIB 200 MG PO CAPS
ORAL_CAPSULE | ORAL | Status: AC
  Administered 2022-08-21: 200 mg via ORAL
  Filled 2022-08-21: qty 1

## 2022-08-21 MED ORDER — CELECOXIB 200 MG PO CAPS
200.0000 mg | ORAL_CAPSULE | Freq: Two times a day (BID) | ORAL | Status: DC
  Administered 2022-08-21 – 2022-08-23 (×5): 200 mg via ORAL
  Filled 2022-08-21 (×6): qty 1

## 2022-08-21 MED ORDER — FENTANYL CITRATE (PF) 100 MCG/2ML IJ SOLN
INTRAMUSCULAR | Status: AC
  Filled 2022-08-21: qty 2

## 2022-08-21 MED ORDER — CHLORHEXIDINE GLUCONATE 0.12 % MT SOLN: 15.0000 mL | Freq: Once | OROMUCOSAL | Status: AC

## 2022-08-21 MED ORDER — SCOPOLAMINE 1 MG/3DAYS TD PT72
MEDICATED_PATCH | TRANSDERMAL | Status: DC | PRN
  Administered 2022-08-21: 1 via TRANSDERMAL

## 2022-08-21 MED ORDER — HYDROCODONE-ACETAMINOPHEN 5-325 MG PO TABS
1.0000 | ORAL_TABLET | ORAL | Status: DC | PRN
  Administered 2022-08-21 – 2022-08-22 (×2): 1 via ORAL
  Filled 2022-08-21 (×2): qty 1

## 2022-08-21 MED ORDER — LIDOCAINE HCL (CARDIAC) PF 100 MG/5ML IV SOSY
PREFILLED_SYRINGE | INTRAVENOUS | Status: DC | PRN
  Administered 2022-08-21: 60 mg via INTRAVENOUS

## 2022-08-21 MED ORDER — ALVIMOPAN 12 MG PO CAPS
ORAL_CAPSULE | ORAL | Status: AC
  Administered 2022-08-21: 12 mg via ORAL
  Filled 2022-08-21: qty 1

## 2022-08-21 MED ORDER — BUPIVACAINE HCL (PF) 0.5 % IJ SOLN
INTRAMUSCULAR | Status: AC
  Filled 2022-08-21: qty 30

## 2022-08-21 MED ORDER — SODIUM CHLORIDE 0.9 % IV SOLN
2.0000 g | INTRAVENOUS | Status: AC
  Administered 2022-08-21: 2 g via INTRAVENOUS

## 2022-08-21 MED ORDER — HYDROMORPHONE HCL 1 MG/ML IJ SOLN
INTRAMUSCULAR | Status: AC
  Administered 2022-08-21: 0.25 mg via INTRAVENOUS
  Filled 2022-08-21: qty 1

## 2022-08-21 MED ORDER — SUGAMMADEX SODIUM 200 MG/2ML IV SOLN
INTRAVENOUS | Status: DC | PRN
  Administered 2022-08-21: 200 mg via INTRAVENOUS

## 2022-08-21 MED ORDER — ORAL CARE MOUTH RINSE: 15.0000 mL | Freq: Once | OROMUCOSAL | Status: AC

## 2022-08-21 MED ORDER — PHENYLEPHRINE 80 MCG/ML (10ML) SYRINGE FOR IV PUSH (FOR BLOOD PRESSURE SUPPORT): PREFILLED_SYRINGE | INTRAVENOUS | Status: DC | PRN

## 2022-08-21 MED ORDER — SODIUM CHLORIDE 0.9 % IV SOLN: INTRAVENOUS | Status: DC

## 2022-08-21 MED ORDER — ACETAMINOPHEN 500 MG PO TABS
ORAL_TABLET | ORAL | Status: AC
  Administered 2022-08-21: 1000 mg via ORAL
  Filled 2022-08-21: qty 2

## 2022-08-21 MED ORDER — GABAPENTIN 300 MG PO CAPS
300.0000 mg | ORAL_CAPSULE | Freq: Two times a day (BID) | ORAL | Status: DC
  Administered 2022-08-21 – 2022-08-23 (×4): 300 mg via ORAL
  Filled 2022-08-21 (×5): qty 1

## 2022-08-21 MED ORDER — LACTATED RINGERS IV SOLN: INTRAVENOUS | Status: DC

## 2022-08-21 MED ORDER — ALVIMOPAN 12 MG PO CAPS: 12.0000 mg | ORAL_CAPSULE | ORAL | Status: AC

## 2022-08-21 SURGICAL SUPPLY — 88 items
BAG LAPAROSCOPIC 12 15 PORT 16 (BASKET) IMPLANT
BAG RETRIEVAL 12/15 (BASKET) ×1
BLADE CLIPPER SURG (BLADE) ×1 IMPLANT
BLADE SURG SZ10 CARB STEEL (BLADE) ×1 IMPLANT
BLADE SURG SZ11 CARB STEEL (BLADE) ×1 IMPLANT
CANNULA REDUC XI 12-8 STAPL (CANNULA) ×1
CANNULA REDUCER 12-8 DVNC XI (CANNULA) ×1 IMPLANT
CLIP LIGATING HEM O LOK PURPLE (MISCELLANEOUS) IMPLANT
COVER TIP SHEARS 8 DVNC (MISCELLANEOUS) ×1 IMPLANT
COVER TIP SHEARS 8MM DA VINCI (MISCELLANEOUS) ×1
DERMABOND ADVANCED .7 DNX12 (GAUZE/BANDAGES/DRESSINGS) ×1 IMPLANT
DRAPE ARM DVNC X/XI (DISPOSABLE) ×4 IMPLANT
DRAPE COLUMN DVNC XI (DISPOSABLE) ×1 IMPLANT
DRAPE DA VINCI XI ARM (DISPOSABLE) ×4
DRAPE DA VINCI XI COLUMN (DISPOSABLE) ×1
DRSG OPSITE POSTOP 4X10 (GAUZE/BANDAGES/DRESSINGS) IMPLANT
DRSG OPSITE POSTOP 4X8 (GAUZE/BANDAGES/DRESSINGS) IMPLANT
ELECT BLADE 6.5 EXT (BLADE) IMPLANT
ELECT CAUTERY BLADE 6.4 (BLADE) IMPLANT
ELECT REM PT RETURN 9FT ADLT (ELECTROSURGICAL) ×1
ELECTRODE REM PT RTRN 9FT ADLT (ELECTROSURGICAL) ×1 IMPLANT
GLOVE BIO SURGEON STRL SZ 6.5 (GLOVE) ×3 IMPLANT
GLOVE BIOGEL PI IND STRL 6.5 (GLOVE) ×3 IMPLANT
GOWN STRL REUS W/ TWL LRG LVL3 (GOWN DISPOSABLE) ×6 IMPLANT
GOWN STRL REUS W/TWL LRG LVL3 (GOWN DISPOSABLE) ×10
HANDLE YANKAUER SUCT BULB TIP (MISCELLANEOUS) ×1 IMPLANT
IRRIGATION STRYKERFLOW (MISCELLANEOUS) IMPLANT
IRRIGATOR STRYKERFLOW (MISCELLANEOUS)
IRRIGATOR SUCT 8 DISP DVNC XI (IRRIGATION / IRRIGATOR) IMPLANT
IRRIGATOR SUCTION 8MM XI DISP (IRRIGATION / IRRIGATOR)
IV NS 1000ML (IV SOLUTION)
IV NS 1000ML BAXH (IV SOLUTION) IMPLANT
KIT IMAGING PINPOINTPAQ (MISCELLANEOUS) IMPLANT
KIT PINK PAD W/HEAD ARE REST (MISCELLANEOUS) ×1
KIT PINK PAD W/HEAD ARM REST (MISCELLANEOUS) ×1 IMPLANT
LABEL OR SOLS (LABEL) IMPLANT
MANIFOLD NEPTUNE II (INSTRUMENTS) ×1 IMPLANT
NDL INSUFFLATION 14GA 120MM (NEEDLE) ×1 IMPLANT
NEEDLE HYPO 22GX1.5 SAFETY (NEEDLE) ×1 IMPLANT
NEEDLE INSUFFLATION 14GA 120MM (NEEDLE) ×1 IMPLANT
OBTURATOR OPTICAL STANDARD 8MM (TROCAR) ×1
OBTURATOR OPTICAL STND 8 DVNC (TROCAR) ×1
OBTURATOR OPTICALSTD 8 DVNC (TROCAR) ×1 IMPLANT
PACK COLON CLEAN CLOSURE (MISCELLANEOUS) ×1 IMPLANT
PACK LAP CHOLECYSTECTOMY (MISCELLANEOUS) ×1 IMPLANT
PORT ACCESS TROCAR AIRSEAL 5 (TROCAR) ×1 IMPLANT
RELOAD STAPLE 45 3.5 BLU DVNC (STAPLE) IMPLANT
RELOAD STAPLE 60 2.5 WHT DVNC (STAPLE) IMPLANT
RELOAD STAPLE 60 3.5 BLU DVNC (STAPLE) IMPLANT
RELOAD STAPLER 2.5X60 WHT DVNC (STAPLE) ×1 IMPLANT
RELOAD STAPLER 3.5X45 BLU DVNC (STAPLE) IMPLANT
RELOAD STAPLER 3.5X60 BLU DVNC (STAPLE) ×2 IMPLANT
RETRACTOR WOUND ALXS 18CM SML (MISCELLANEOUS) IMPLANT
RTRCTR WOUND ALEXIS O 18CM SML (MISCELLANEOUS)
SEAL CANN UNIV 5-8 DVNC XI (MISCELLANEOUS) ×3 IMPLANT
SEAL XI 5MM-8MM UNIVERSAL (MISCELLANEOUS) ×3
SEALER VESSEL DA VINCI XI (MISCELLANEOUS) ×1
SEALER VESSEL EXT DVNC XI (MISCELLANEOUS) IMPLANT
SET TRI-LUMEN FLTR TB AIRSEAL (TUBING) ×1 IMPLANT
SOLUTION ELECTROLUBE (MISCELLANEOUS) ×1 IMPLANT
SPONGE T-LAP 18X18 ~~LOC~~+RFID (SPONGE) ×1 IMPLANT
SPONGE T-LAP 4X18 ~~LOC~~+RFID (SPONGE) ×1 IMPLANT
STAPLER 45 DA VINCI SURE FORM (STAPLE)
STAPLER 45 SUREFORM DVNC (STAPLE) IMPLANT
STAPLER 60 DA VINCI SURE FORM (STAPLE) ×1
STAPLER 60 SUREFORM DVNC (STAPLE) IMPLANT
STAPLER CANNULA SEAL DVNC XI (STAPLE) ×1 IMPLANT
STAPLER CANNULA SEAL XI (STAPLE) ×1
STAPLER RELOAD 2.5X60 WHITE (STAPLE) ×1
STAPLER RELOAD 2.5X60 WHT DVNC (STAPLE) ×1
STAPLER RELOAD 3.5X45 BLU DVNC (STAPLE)
STAPLER RELOAD 3.5X45 BLUE (STAPLE)
STAPLER RELOAD 3.5X60 BLU DVNC (STAPLE) ×2
STAPLER RELOAD 3.5X60 BLUE (STAPLE) ×2
SUT MNCRL 4-0 (SUTURE) ×2
SUT MNCRL 4-0 27XMFL (SUTURE) ×2
SUT SILK 3 0 SH 30 (SUTURE) IMPLANT
SUT STRATAFIX 0 PDS+ CT-2 23 (SUTURE) ×1
SUT VIC AB 3-0 SH 27 (SUTURE) ×1
SUT VIC AB 3-0 SH 27X BRD (SUTURE) ×1 IMPLANT
SUT VICRYL 0 AB UR-6 (SUTURE) IMPLANT
SUT VLOC 90 6 CV-15 VIOLET (SUTURE) ×1 IMPLANT
SUTURE MNCRL 4-0 27XMF (SUTURE) ×2 IMPLANT
SUTURE STRATFX 0 PDS+ CT-2 23 (SUTURE) ×1 IMPLANT
SYR 30ML LL (SYRINGE) ×1 IMPLANT
TRAP FLUID SMOKE EVACUATOR (MISCELLANEOUS) ×1 IMPLANT
TRAY FOLEY MTR SLVR 16FR STAT (SET/KITS/TRAYS/PACK) ×1 IMPLANT
WATER STERILE IRR 500ML POUR (IV SOLUTION) ×1 IMPLANT

## 2022-08-21 NOTE — Interval H&P Note (Signed)
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  The various methods of treatment have been discussed with the patient and family. After consideration of risks, benefits and other options for treatment, the patient has consented to  Procedure(s): XI [place] (Right) as a surgical intervention.  The patient's history has been reviewed, patient examined, no change in status, stable for surgery.  I have reviewed the patient's chart and labs.  Questions were answered to the patient's satisfaction.     Herbert Pun

## 2022-08-21 NOTE — Transfer of Care (Signed)
Immediate Anesthesia Transfer of Care Note  Patient: Brianna Dickerson  Procedure(s) Performed: XI ROBOT ASSISTED LAPAROSCOPIC Hemi COLECTOMY (Right: Abdomen)  Patient Location: PACU  Anesthesia Type:General  Level of Consciousness: drowsy  Airway & Oxygen Therapy: Patient Spontanous Breathing and Patient connected to face mask oxygen  Post-op Assessment: Report given to RN and Post -op Vital signs reviewed and stable  Post vital signs: Reviewed and stable  Last Vitals:  Vitals Value Taken Time  BP 131/65 08/21/22 1445  Temp 35.9 C 08/21/22 1440  Pulse 79 08/21/22 1455  Resp 11 08/21/22 1455  SpO2 96 % 08/21/22 1455  Vitals shown include unvalidated device data.  Last Pain:  Vitals:   08/21/22 1440  TempSrc:   PainSc: Asleep         Complications: No notable events documented.

## 2022-08-21 NOTE — Anesthesia Preprocedure Evaluation (Addendum)
Anesthesia Evaluation  Patient identified by MRN, date of birth, ID band Patient awake    Reviewed: Allergy & Precautions, NPO status , Patient's Chart, lab work & pertinent test results  History of Anesthesia Complications Negative for: history of anesthetic complications  Airway Mallampati: II  TM Distance: >3 FB Neck ROM: full    Dental  (+) Teeth Intact   Pulmonary neg pulmonary ROS,    Pulmonary exam normal        Cardiovascular Normal cardiovascular exam+ Valvular Problems/Murmurs MVP      Neuro/Psych negative neurological ROS  negative psych ROS   GI/Hepatic Neg liver ROS, Colon cancer    Endo/Other  negative endocrine ROS  Renal/GU negative Renal ROS     Musculoskeletal   Abdominal   Peds  Hematology negative hematology ROS (+)   Anesthesia Other Findings Past Medical History: No date: Abnormal uterine bleeding No date: Anemia No date: B12 deficiency No date: Fibrocystic disease of both breasts 05/12/15; 06/03/16: History of mammogram     Comment:  BIRAD 2; NEG 05/12/2015; 05/14/2016: History of Papanicolaou smear of cervix     Comment:  NEG; -/- 2018: Lyme disease 07/2022: Malignant neoplasm of ascending colon ([place]) No date: Solitary cyst of breast     Comment:  NEG  Past Surgical History: 07/29/97;02/28/00;111/4/00: BREAST CYST ASPIRATION; Bilateral     Comment:  RT; LT; LT; RT 1986: CESAREAN SECTION 04/10/2007: COLONOSCOPY 08/15/2017: COLONOSCOPY 08/05/2022: COLONOSCOPY 11/26/2005: ENDOMETRIAL BIOPSY     Comment:  EARLY SECRETORY ENDOMETRIUM C SLIGHTLY DISORDERED               PATTERN  BMI    Body Mass Index: 24.20 kg/m      Reproductive/Obstetrics negative OB ROS                            Anesthesia Physical Anesthesia Plan  ASA: 2  Anesthesia Plan: General ETT   Post-op Pain Management: Tylenol PO (pre-op)*, Gabapentin PO (pre-op)*, Celebrex PO (pre-op)*,  Dilaudid IV and Ketamine IV*   Induction: Intravenous  PONV Risk Score and Plan: 4 or greater and Ondansetron, Dexamethasone and Treatment may vary due to age or medical condition  Airway Management Planned: Oral ETT  Additional Equipment:   Intra-op Plan:   Post-operative Plan: Extubation in OR  Informed Consent: I have reviewed the patients History and Physical, chart, labs and discussed the procedure including the risks, benefits and alternatives for the proposed anesthesia with the patient or authorized representative who has indicated his/her understanding and acceptance.     Dental Advisory Given  Plan Discussed with: Anesthesiologist, CRNA and Surgeon  Anesthesia Plan Comments: (Patient consented for risks of anesthesia including but not limited to:  - adverse reactions to medications - damage to eyes, teeth, lips or other oral mucosa - nerve damage due to positioning  - sore throat or hoarseness - Damage to heart, brain, nerves, lungs, other parts of body or loss of life  Patient voiced understanding.)        Anesthesia Quick Evaluation

## 2022-08-21 NOTE — Progress Notes (Signed)
19 - received report 50 - family at bedside, assessment completed 2030 - pt c/o pain, prn analgesic administered per physician's order 2114 - scheduled medication administration and pain reassessment. Ice replenished per pt request.  2328 - resting with eyes closed, respirations even and unlabored, no obvious signs or symptoms of distress or discomfort noted 0137 - resting with eyes closed, respirations even and unlabored, no obvious signs or symptoms of distress or discomfort noted 0340 - pt reports pain mild at 2/10 requests refill of ice pack. Reports hear "tummy rumblings", but no gas.  0617 - resting, reporting increase in pain, but tolerable. Refusing analgesics, okay with ice

## 2022-08-21 NOTE — Op Note (Signed)
Preoperative diagnosis: Right colon cancer  Postoperative diagnosis: Right colon cancer  Procedure: Robotic assisted laparoscopic right colectomy.   Anesthesia: GETA   Surgeon: Herbert Pun, MD  Assistant: None    Wound Classification: clean contaminated   Specimen: Right colon   Complications: None   Estimated Blood Loss: 10 mL   Indications: Patient is a 66 y.o. female who was found to have carcinoma involving the ascending colon. Resection was indicated for oncologic treatment.   FIndings: 1.  Ascending colon mass with tattoo 2.  Adequate hemostasis.  4.  No gross metastasis noted   Description of procedure: The patient was placed on the operating table in the supine position, both arms tucked. General anesthesia was induced. A time-out was completed verifying correct patient, procedure, site, positioning, and implant(s) and/or special equipment prior to beginning this procedure. The abdomen was prepped and draped in the usual sterile fashion.    Veress needle was inserted in the Palmer's point.  Gas insufflation was initiated until the abdominal pressure was measured at 15 mmHg.  A small incision was done in the left upper quadrant.  Access to the abdominal cavity was done in the Optiview fashion.  Afterwards, 3 additional incision was made 5 cm apart along the left side of the abdominal wall from the initial incision and two additional 29m port and one 12 mm port were placed under direct visualization. 5 mm assistant port was then placed between 2 of the robotic ports.  No injuries from trocar placements were noted. Exparel infused on the bilateral abdominal wall. The table was placed in the reverse Trendelenburg position with the right side elevated.  Xi robotic platform was then brought to the operative field and docked at an angle from the left lower quadrant.  Tip up grasper and scissors was placed in right arm ports.  Fenestrated bipolar in left arm port.    Examination of the abdominal cavity noted no signs of gross metastasis.  The ascending colon with inking was noted.  I elevated the cecum and identified the ileocolic pedicle.  This was dissected circumferentially and divided with vessel sealer.  Medial to lateral dissection was started until the right ureter was identified.  Then dissection was continued in a medial to lateral fashion in the cephalad way until the duodenum was identified in the right upper quadrant.  The duodenum was carefully dissected down.  The dissection continued in a more lateral until the hepatic flexure was identified.  Then attention was on the terminal ileum.  The mesentery of the terminal ileum was dissected off to the bowel.  Then the point of transection of the transverse colon was identified.  The mesentery of the transverse colon was divided with vessel sealer being very careful with the duodenum.  Once the two-point of dissection were identified, 5 mg of ICG green were administered.  This was done to be able to identify the perfusion of the colonic flap pedicles.  Adequate ICG perfusion was identified.  The rest of the mesentery was divided with vessel sealer.  Then the ileum and the transverse colon was divided with linear stapler.   The transected specimen was placed atop the liver.  The small bowel was then brought towards the transverse colon and a isoperistaltic anastomosis was created.  Enterotomies were made in the small bowel and the transverse colon, small bowel enterotomy created 2 cm from the staple line and transverse colon enterotomy made 8 cm from the staple line.  60 mm blue load  stapler placed through these enterotomies and new anastomosis created.  The enterotomy was then closed by placing 2 anchor sutures at the 2 apexes using 2-0 Vicryl, then running 3-0 V-Lock in a 2 layer fashion.    No bleeding or additional pathology was noted.12 mm port removed and a 15 mm large Endo Catch bag was placed through the 12  mm port site and into the abdomen.  Right colon specimen was placed in the bag.  Robot was then undocked, and the remaining port sites were removed, the abdomen was allowed to collapse.  The 12 mm port site fascia was then extended until the specimen was able to be removed out of the abdomen completely.  This port site was then closed primarily using 0 Vicryl x2. Deep dermal then closed with 3-0 vicryl interrupted fashion.   All instruments were removed and the [employer] robot was undocked.  A 3 cm incision was done on the suprapubic area.  Dissection was carried down to the fascia.  The fascia was opened and abdominal cavity was entered.  The specimen was removed.   Using a clean closure technique, the fascia was closed with 0 STRATAFIX.  All skin incisions then closed with subcuticular sutures Monocryl 4-0.  Wounds then dressed with dermabond.   The patient tolerated the procedure well, awakened from anesthesia and was taken to the postanesthesia care unit in satisfactory condition.  Foley still in place.  Sponge count and instrument count correct at the end of the procedure.

## 2022-08-21 NOTE — Anesthesia Procedure Notes (Signed)
Procedure Name: Intubation Date/Time: 08/21/2022 10:30 AM  Performed by: Barbarajean Kinzler, CRNAPre-anesthesia Checklist: Patient identified, Patient being monitored, Timeout performed, Emergency Drugs available and Suction available Patient Re-evaluated:Patient Re-evaluated prior to induction Oxygen Delivery Method: Circle system utilized Preoxygenation: Pre-oxygenation with 100% oxygen Induction Type: IV induction Ventilation: Mask ventilation without difficulty Laryngoscope Size: Miller and 2 Grade View: Grade I Tube type: Oral Tube size: 6.5 mm Number of attempts: 1 Placement Confirmation: ETT inserted through vocal cords under direct vision, positive ETCO2 and breath sounds checked- equal and bilateral Secured at: 19 cm Tube secured with: Tape Dental Injury: Teeth and Oropharynx as per pre-operative assessment

## 2022-08-22 ENCOUNTER — Other Ambulatory Visit: Payer: Medicare PPO

## 2022-08-22 LAB — BASIC METABOLIC PANEL
Anion gap: 3 — ABNORMAL LOW (ref 5–15)
BUN: 9 mg/dL (ref 8–23)
CO2: 21 mmol/L — ABNORMAL LOW (ref 22–32)
Calcium: 6.9 mg/dL — ABNORMAL LOW (ref 8.9–10.3)
Chloride: 118 mmol/L — ABNORMAL HIGH (ref 98–111)
Creatinine, Ser: 0.59 mg/dL (ref 0.44–1.00)
GFR, Estimated: >60 mL/min (ref >60)
Glucose, Bld: 82 mg/dL (ref 70–99)
Potassium: 3.3 mmol/L — ABNORMAL LOW (ref 3.5–5.1)
Sodium: 142 mmol/L (ref 135–145)

## 2022-08-22 LAB — CBC
HCT: 28.6 % — ABNORMAL LOW (ref 36.0–46.0)
Hemoglobin: 9.4 g/dL — ABNORMAL LOW (ref 12.0–15.0)
MCH: 28 pg (ref 26.0–34.0)
MCHC: 32.9 g/dL (ref 30.0–36.0)
MCV: 85.1 fL (ref 80.0–100.0)
Platelets: 185 10*3/uL (ref 150–400)
RBC: 3.36 MIL/uL — ABNORMAL LOW (ref 3.87–5.11)
RDW: 12.4 % (ref 11.5–15.5)
WBC: 9.5 10*3/uL (ref 4.0–10.5)
nRBC: 0 % (ref 0.0–0.2)

## 2022-08-22 MED ORDER — POTASSIUM & SODIUM PHOSPHATES 280-160-250 MG PO PACK
1.0000 | PACK | Freq: Three times a day (TID) | ORAL | Status: DC
  Administered 2022-08-22 – 2022-08-23 (×4): 1 via ORAL
  Filled 2022-08-22 (×4): qty 1

## 2022-08-22 NOTE — Anesthesia Postprocedure Evaluation (Signed)
Anesthesia Post Note  Patient: Brianna Dickerson  Procedure(s) Performed: XI ROBOT ASSISTED LAPAROSCOPIC Hemi COLECTOMY (Right: Abdomen)  Patient location during evaluation: PACU Anesthesia Type: General Level of consciousness: awake and alert, oriented and patient cooperative Pain management: pain level controlled Vital Signs Assessment: post-procedure vital signs reviewed and stable Respiratory status: spontaneous breathing, nonlabored ventilation and respiratory function stable Cardiovascular status: blood pressure returned to baseline and stable Postop Assessment: adequate PO intake Anesthetic complications: no   No notable events documented.   Last Vitals:  Vitals:   08/21/22 1925 08/22/22 0539  BP: 139/72 124/72  Pulse: 94 72  Resp: 16 15  Temp: 36.8 C 36.8 C  SpO2: 99% 100%    Last Pain:  Vitals:   08/22/22 0617  TempSrc:   PainSc: [place]

## 2022-08-22 NOTE — Progress Notes (Addendum)
1833 - received report 2026 - spouse at bedside, pt seen ambulating halls since receiving report by this RN accompanied by spouse, denies pain, currently using cellular device, ice pack refilled per pt request 2125 - medication administration 2300 - resting with eyes closed, no obvious signs of discomfort or distress noted, respirations even and unlabored 0016 - pt request ice pitcher be refilled, no other complaints 0155 - resting in bed providing recent trip to restroom "almost half an hour ago" 0349 - pt resting with eyes closed, respirations even and unlabored, no signs of distress 0445 - pt reports bloody stool explaining "it looked like a patchy blob and the toilet water was red". Pt admits she flushed the toilet. This RN placed hat in toilet and instructed pt to allow RN to assess bowel movements.  Patient verbalizes understanding.  0520 - pt called this RN to room to assess stool.  Old blood noted to hat.  Charge nurse [place] spoke to pt with this RN present.  Pt becomes tearful and apologizes for "worrying so much".  Pt educated on old blood versus frank red blood and verbalizes understanding.

## 2022-08-22 NOTE — TOC Initial Note (Signed)
Transition of Care [facility] - Initial/Assessment Note    Patient Details  Name: Brianna Dickerson MRN: [id number] Date of Birth: 02-25-1956  Transition of Care [facility]) CM/SW Contact:    Beverly Sessions, RN Phone Number: 08/22/2022, 11:17 AM  Clinical Narrative:                     Transition of Care [facility]) Screening Note   Patient Details  Name: Brianna Dickerson Date of Birth: 30-Apr-1956   Transition of Care [facility]) CM/SW Contact:    Beverly Sessions, RN Phone Number: 08/22/2022, 11:18 AM    Transition of Care Department [facility]) has reviewed patient and no TOC needs have been identified at this time. We will continue to monitor patient advancement through interdisciplinary progression rounds. If new patient transition needs arise, please place a TOC consult.       Patient Goals and CMS Choice        Expected Discharge Plan and Services                                                Prior Living Arrangements/Services                       Activities of Daily Living Home Assistive Devices/Equipment: Eyeglasses ADL Screening (condition at time of admission) Patient's cognitive ability adequate to safely complete daily activities?: Yes Is the patient deaf or have difficulty hearing?: No Does the patient have difficulty seeing, even when wearing glasses/contacts?: No Does the patient have difficulty concentrating, remembering, or making decisions?: No Patient able to express need for assistance with ADLs?: Yes Does the patient have difficulty dressing or bathing?: No Independently performs ADLs?: Yes (appropriate for developmental age) Does the patient have difficulty walking or climbing stairs?: No Weakness of Legs: None Weakness of Arms/Hands: None  Permission Sought/Granted                  Emotional Assessment              Admission diagnosis:  Colon cancer [facility]) [C18.9] Patient Active Problem List   Diagnosis Date Noted    Colon cancer ([place]) 08/21/2022   Fibrocystic disease of both breasts 05/15/2017   Mitral valve prolapse 05/15/2017   B12 deficiency 04/29/2016   History of Lyme disease 04/29/2016   PCP:  Rusty Aus, MD Pharmacy:   [facility] [phone] - [address], [address] AT [address] [place] [address] Phone: [phone] Fax: [phone]     Social Determinants of Health (SDOH) Interventions Housing Interventions: Intervention Not Indicated  Readmission Risk Interventions     No data to display

## 2022-08-22 NOTE — Progress Notes (Signed)
Patient ID: Brianna Dickerson, female   DOB: 1956-08-03, 66 y.o.   MRN: [id number]     [place] Hospital Day(s): 1.   Interval History: Patient seen and examined, no acute events or new complaints overnight. Patient reports feeling well this morning.  She endorses that the pain is controlled.  She denies passing gas yet.  Denies any nausea or vomiting.  Vital signs in last 24 hours: [min-max] current  Temp:  [96.7 F (35.9 C)-98.3 F (36.8 C)] 98.3 F (36.8 C) (11/02 0729) Pulse Rate:  [72-94] 82 (11/02 0729) Resp:  [7-19] 18 (11/02 0729) BP: (113-154)/(58-97) 137/63 (11/02 0729) SpO2:  [44 %-100 %] 100 % (11/02 0729) Weight:  [68 kg] 68 kg (11/01 0826)     Height: '5\' 6"'$  (167.6 cm) Weight: 68 kg BMI (Calculated): 24.21   Physical Exam:  Constitutional: alert, cooperative and no distress  Respiratory: breathing non-labored at rest  Cardiovascular: regular rate and sinus rhythm  Gastrointestinal: soft, non-tender, and non-distended  Labs:     Latest Ref Rng & Units 08/22/2022    5:55 AM  CBC  WBC 4.0 - 10.5 K/uL 9.5   Hemoglobin 12.0 - 15.0 g/dL 9.4   Hematocrit 36.0 - 46.0 % 28.6   Platelets 150 - 400 K/uL 185       Latest Ref Rng & Units 08/22/2022    5:55 AM 08/12/2022    3:32 PM  CMP  Glucose 70 - 99 mg/dL 82    BUN 8 - 23 mg/dL 9    Creatinine 0.44 - 1.00 mg/dL 0.59  0.70   Sodium 135 - 145 mmol/L 142    Potassium 3.5 - 5.1 mmol/L 3.3    Chloride 98 - 111 mmol/L 118    CO2 22 - 32 mmol/L 21    Calcium 8.9 - 10.3 mg/dL 6.9      Imaging studies: No new pertinent imaging studies   Assessment/Plan:  66 y.o. female with ascending colon cancer 1 Day Post-Op s/p robotic right hemicolectomy.  -Adequate vital signs, no fever -Patient seems to be doing well with pain control -Patient is still not passing gas but no nausea or vomiting.  We will advance diet to full liquids.  Continue Entereg -Discontinue Foley catheter -Encouraged the patient to  ambulate -DVT prophylaxis   Arnold Long, MD

## 2022-08-23 LAB — CBC
HCT: 30.5 % — ABNORMAL LOW (ref 36.0–46.0)
Hemoglobin: 9.8 g/dL — ABNORMAL LOW (ref 12.0–15.0)
MCH: 27.5 pg (ref 26.0–34.0)
MCHC: 32.1 g/dL (ref 30.0–36.0)
MCV: 85.7 fL (ref 80.0–100.0)
Platelets: 181 10*3/uL (ref 150–400)
RBC: 3.56 MIL/uL — ABNORMAL LOW (ref 3.87–5.11)
RDW: 12.6 % (ref 11.5–15.5)
WBC: 8.9 10*3/uL (ref 4.0–10.5)
nRBC: 0 % (ref 0.0–0.2)

## 2022-08-23 LAB — HEMOGLOBIN AND HEMATOCRIT, BLOOD
HCT: 28.8 % — ABNORMAL LOW (ref 36.0–46.0)
Hemoglobin: 9.4 g/dL — ABNORMAL LOW (ref 12.0–15.0)

## 2022-08-23 LAB — BASIC METABOLIC PANEL
Anion gap: 4 — ABNORMAL LOW (ref 5–15)
BUN: 14 mg/dL (ref 8–23)
CO2: 25 mmol/L (ref 22–32)
Calcium: 8.2 mg/dL — ABNORMAL LOW (ref 8.9–10.3)
Chloride: 112 mmol/L — ABNORMAL HIGH (ref 98–111)
Creatinine, Ser: 0.73 mg/dL (ref 0.44–1.00)
GFR, Estimated: >60 mL/min (ref >60)
Glucose, Bld: 97 mg/dL (ref 70–99)
Potassium: 4.3 mmol/L (ref 3.5–5.1)
Sodium: 141 mmol/L (ref 135–145)

## 2022-08-23 LAB — PHOSPHORUS: Phosphorus: 2.9 mg/dL (ref 2.5–4.6)

## 2022-08-23 NOTE — Progress Notes (Signed)
Patient ID: Brianna Dickerson, female   DOB: 02-06-1956, 66 y.o.   MRN: [id number]     [facility] Day(s): 2.   Interval History: Patient seen and examined, no acute events or new complaints overnight. Patient reports having pain control.  She is concerned about having bloody bowel movements.  She has been passing gas and having liquid bowel movement.  She has seen blood in the stool.  She described the blood as dark red. Vital signs in last 24 hours: [min-max] current  Temp:  [98 F (36.7 C)-98.3 F (36.8 C)] 98.3 F (36.8 C) (11/03 0824) Pulse Rate:  [77-86] 86 (11/03 0824) Resp:  [17-18] 18 (11/03 0824) BP: (107-122)/(56-68) 122/68 (11/03 0824) SpO2:  [95 %-98 %] 98 % (11/03 0824)     Height: '5\' 6"'$  (167.6 cm) Weight: 68 kg BMI (Calculated): 24.21   Physical Exam:  Constitutional: alert, cooperative and no distress  Respiratory: breathing non-labored at rest  Cardiovascular: regular rate and sinus rhythm  Gastrointestinal: soft, non-tender, and non-distended  Labs:     Latest Ref Rng & Units 08/23/2022    1:54 PM 08/23/2022    4:36 AM 08/22/2022    5:55 AM  CBC  WBC 4.0 - 10.5 K/uL  8.9  9.5   Hemoglobin 12.0 - 15.0 g/dL 9.4  9.8  9.4   Hematocrit 36.0 - 46.0 % 28.8  30.5  28.6   Platelets 150 - 400 K/uL  181  185       Latest Ref Rng & Units 08/23/2022    4:36 AM 08/22/2022    5:55 AM 08/12/2022    3:32 PM  CMP  Glucose 70 - 99 mg/dL 97  82    BUN 8 - 23 mg/dL 14  9    Creatinine 0.44 - 1.00 mg/dL 0.73  0.59  0.70   Sodium 135 - 145 mmol/L 141  142    Potassium 3.5 - 5.1 mmol/L 4.3  3.3    Chloride 98 - 111 mmol/L 112  118    CO2 22 - 32 mmol/L 25  21    Calcium 8.9 - 10.3 mg/dL 8.2  6.9      Imaging studies: No new pertinent imaging studies   Assessment/Plan:  66 y.o. female with ascending colon cancer 2 Day Post-Op s/p robotic right hemicolectomy.   -Recovering slowly but adequately -Today she has been passing gas and having liquid bowel  movements.  Bowel movement has been seen with dark red blood. -Serial hemoglobin has been stable.  No decreasing hemoglobin postop. -Patient tolerating diet -We will observe for 24 more hours to see if there is any clinical deterioration -Encourage patient to ambulate -We will hold Lovenox since patient is ambulating and is having blood per rectum.  Arnold Long, MD

## 2022-08-23 NOTE — Progress Notes (Signed)
Pt ambulating in the room independently.

## 2022-08-23 NOTE — Progress Notes (Addendum)
Provider Notification : Windell Moment    I just wanted to let you know that patients continues to have liquid bloody stools. She has had a total of 700 ml so far for me today.   Recommendations:  To check H&H

## 2022-08-23 NOTE — Care Management Important Message (Signed)
Important Message  Patient Details  Name: Brianna Dickerson MRN: [id number] Date of Birth: Aug 14, 1956   Medicare Important Message Given:  N/A - LOS <3 / Initial given by admissions     Dannette Barbara 08/23/2022, 8:38 AM

## 2022-08-24 LAB — HEMOGLOBIN AND HEMATOCRIT, BLOOD
HCT: 29.3 % — ABNORMAL LOW (ref 36.0–46.0)
Hemoglobin: 9.5 g/dL — ABNORMAL LOW (ref 12.0–15.0)

## 2022-08-24 NOTE — Plan of Care (Signed)

## 2022-08-24 NOTE — Progress Notes (Signed)
Discharge instructions were previewed with pt and her husband. IV was taken out. Questions were encourage and answered.

## 2022-08-24 NOTE — Discharge Summary (Signed)
  Patient ID: Brianna Dickerson MRN: [id number] DOB/AGE: 04/19/1956 66 y.o.  Admit date: 08/21/2022 Discharge date: 08/24/2022   Discharge Diagnoses:  Principal Problem:   Colon cancer [facility])   Procedures: Robotic assisted laparoscopic right hemicolectomy  Hospital Course: Patient admitted for surgical management of ascending colon cancer.  She underwent robotic assisted laparoscopic right hemicolectomy.  She has been recovering adequately.  She has been ambulating.  The pain is controlled.  Patient has been passing gas and having bowel movement.  She is tolerating soft diet.  Initially stool was bloody but hemoglobin has been stable.  No sign of active bleeding.  Wounds are healing well they are dry and clean.  Physical Exam Vitals reviewed.  Constitutional:      Appearance: Normal appearance.  HENT:     Head: Normocephalic.  Cardiovascular:     Rate and Rhythm: Normal rate and regular rhythm.  Pulmonary:     Effort: Pulmonary effort is normal.     Breath sounds: Normal breath sounds.  Abdominal:     General: Abdomen is flat.  Musculoskeletal:     Cervical back: Normal range of motion.  Skin:    Capillary Refill: Capillary refill takes less than 2 seconds.  Neurological:     Mental Status: She is alert and oriented to person, place, and time.      Consults: None  Disposition: Discharge disposition: 01-Home or Self Care      Discharge Instructions     Diet - low sodium heart healthy   Complete by: As directed    Increase activity slowly   Complete by: As directed       Allergies as of 08/24/2022       Reactions   Shellfish Allergy Nausea And Vomiting, Other (See Comments)   Shrimp        Medication List     TAKE these medications    aspirin 81 MG chewable tablet Chew 81 mg by mouth daily.   CALCIUM 600 PO Take 1 tablet by mouth 2 (two) times daily.   EMERGEN-C VITAMIN C PO Take 1 tablet by mouth daily as needed.   FLINTSTONES GUMMIES PO Take  2 tablets by mouth daily.   loratadine 10 MG tablet Commonly known as: CLARITIN Take 10 mg by mouth daily as needed for allergies.   Vitamin B-12 5000 MCG Subl Place 5,000 mcg under the tongue daily.   Vitamin D3 25 MCG (1000 UT) Caps Take 1 capsule by mouth daily.        Follow-up Information     Herbert Pun, MD Follow up in 2 week(s).   Specialty: General Surgery Contact information: [address] [place] [address] [phone]

## 2022-08-24 NOTE — Discharge Instructions (Signed)
Diet: Resume home heart healthy regular diet.  ° °Activity: No heavy lifting >20 pounds (children, pets, laundry, garbage) or strenuous activity until follow-up, but light activity and walking are encouraged. Do not drive or drink alcohol if taking narcotic pain medications. ° °Wound care: May shower with soapy water and pat dry (do not rub incisions), but no baths or submerging incision underwater until follow-up. (no swimming)  ° °Medications: Resume all home medications. For mild to moderate pain: acetaminophen (Tylenol) or ibuprofen (if no kidney disease). Combining Tylenol with alcohol can substantially increase your risk of causing liver disease.  ° °Call office ([phone]) at any time if any questions, worsening pain, fevers/chills, bleeding, drainage from incision site, or other concerns. ° °

## 2022-08-29 ENCOUNTER — Inpatient Hospital Stay: Payer: Medicare PPO | Attending: Oncology

## 2022-08-29 ENCOUNTER — Encounter: Payer: Self-pay | Admitting: Oncology

## 2022-08-29 ENCOUNTER — Inpatient Hospital Stay: Payer: Medicare PPO | Admitting: Oncology

## 2022-08-29 VITALS — BP 131/60 | HR 86 | Temp 99.3°F | Resp 16 | Ht 66.0 in | Wt 146.0 lb

## 2022-08-29 DIAGNOSIS — R918 Other nonspecific abnormal finding of lung field: Secondary | ICD-10-CM

## 2022-08-29 DIAGNOSIS — R911 Solitary pulmonary nodule: Secondary | ICD-10-CM | POA: Diagnosis not present

## 2022-08-29 DIAGNOSIS — C182 Malignant neoplasm of ascending colon: Secondary | ICD-10-CM

## 2022-08-29 NOTE — Progress Notes (Signed)
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  INTERVAL HISTORY: Patient is a 66 year old female who recently had screening colonoscopy that noted a suspicious mass in her ascending colon.  Subsequent biopsy and surgery revealed the above-stated malignancy.  She is nearly recovered from her partial colectomy.  She has no neurologic complaints.  She denies any recent fevers or illnesses.  She has a good appetite and denies weight loss.  She has no chest pain, shortness of breath, cough, or hemoptysis.  She denies any nausea, vomiting, constipation, or diarrhea.  She has no melena or hematochezia.  She has no urinary complaints.  Patient offers no further specific complaints today.  REVIEW OF SYSTEMS:   Review of Systems  Constitutional: Negative.  Negative for fever, malaise/fatigue and weight loss.  Respiratory: Negative.  Negative for cough, hemoptysis and shortness of breath.   Cardiovascular: Negative.  Negative for chest pain and leg swelling.  Gastrointestinal: Negative.  Negative for abdominal pain, blood in stool and melena.  Genitourinary: Negative.  Negative for dysuria.  Musculoskeletal: Negative.  Negative for back pain.  Skin: Negative.  Negative for rash.  Neurological: Negative.  Negative for dizziness, focal weakness, weakness and headaches.  Psychiatric/Behavioral: Negative.  The patient is not nervous/anxious.     As per HPI. Otherwise, a complete review of systems is negative.  PAST MEDICAL HISTORY: Past Medical History:  Diagnosis Date   Abnormal uterine bleeding    Anemia    B12 deficiency    Fibrocystic disease of both breasts     History of mammogram 05/12/15; 06/03/16   BIRAD 2; NEG   History of Papanicolaou smear of cervix 05/12/2015; 05/14/2016   NEG; -/-   Lyme disease 2018   Malignant neoplasm of ascending colon ([place]) 07/2022   Osteoarthritis    Osteopenia    Solitary cyst of breast    NEG    PAST SURGICAL HISTORY: Past Surgical History:  Procedure Laterality Date   BREAST CYST ASPIRATION Bilateral 07/29/97;02/28/00;111/4/00   RT; LT; LT; RT   [place]   COLONOSCOPY  04/10/2007   COLONOSCOPY  08/15/2017   COLONOSCOPY  08/05/2022   ENDOMETRIAL BIOPSY  11/26/2005   EARLY SECRETORY ENDOMETRIUM C SLIGHTLY DISORDERED PATTERN    FAMILY HISTORY: Family History  Problem Relation Age of Onset   Coronary artery disease Mother    Cerebrovascular Accident Mother    Hyperlipidemia Mother    Hypertension Mother    Chronic Renal Failure Mother    Hypercholesterolemia Mother    Transient ischemic attack Father 2   Breast cancer Neg Hx     ADVANCED DIRECTIVES (Y/N):  N  HEALTH MAINTENANCE: Social History   Tobacco Use   Smoking status: Never   Smokeless tobacco: Never  Vaping Use   Vaping Use: Never used  Substance Use Topics   Alcohol use: No   Drug use: No     Colonoscopy:  PAP:  Bone density:  Lipid panel:  Allergies  Allergen Reactions   Shellfish Allergy Nausea And Vomiting and Other (See Comments)    Shrimp    Current Outpatient Medications  Medication Sig Dispense Refill   Ascorbic Acid (VITAMIN C) 100 MG tablet Take 100  mg by mouth daily.     aspirin 81 MG chewable tablet Chew 81 mg by mouth daily.     Calcium Carbonate (CALCIUM 600 PO) Take 1 tablet by mouth 2 (two) times daily.     Cholecalciferol (VITAMIN D3) 1000 units CAPS Take 1 capsule by mouth daily.     Cyanocobalamin (VITAMIN B-12) 5000 MCG SUBL Place 5,000 mcg under the tongue daily.     loratadine (CLARITIN) 10 MG tablet Take 10 mg by mouth daily as needed for allergies.     Multiple Vitamins-Minerals  (EMERGEN-C VITAMIN C PO) Take 1 tablet by mouth daily as needed.     Pediatric Multivit-Minerals (FLINTSTONES GUMMIES PO) Take 2 tablets by mouth daily.     No current facility-administered medications for this visit.    OBJECTIVE: Vitals:   08/29/22 1351  BP: 131/60  Pulse: 86  Resp: 16  Temp: 99.3 F (37.4 C)  SpO2: 100%     Body mass index is 23.57 kg/m.    ECOG FS:0 - Asymptomatic  General: Well-developed, well-nourished, no acute distress. Eyes: Pink conjunctiva, anicteric sclera. HEENT: Normocephalic, moist mucous membranes. Lungs: No audible wheezing or coughing. Heart: Regular rate and rhythm. Abdomen: Soft, nontender, no obvious distention. Musculoskeletal: No edema, cyanosis, or clubbing. Neuro: Alert, answering all questions appropriately. Cranial nerves grossly intact. Skin: No rashes or petechiae noted. Psych: Normal affect. Lymphatics: No cervical, calvicular, axillary or inguinal LAD.   LAB RESULTS:  Lab Results  Component Value Date   NA 141 08/23/2022   K 4.3 08/23/2022   CL 112 (H) 08/23/2022   CO2 25 08/23/2022   GLUCOSE 97 08/23/2022   BUN 14 08/23/2022   CREATININE 0.73 08/23/2022   CALCIUM 8.2 (L) 08/23/2022   GFRNONAA >60 08/23/2022    Lab Results  Component Value Date   WBC 8.9 08/23/2022   HGB 9.5 (L) 08/24/2022   HCT 29.3 (L) 08/24/2022   MCV 85.7 08/23/2022   PLT 181 08/23/2022     STUDIES: CT CHEST W CONTRAST  Result Date: 08/20/2022 CLINICAL DATA:  Recent diagnosis of colon cancer.  Chest staging. * Tracking Code: BO * EXAM: CT CHEST WITH CONTRAST TECHNIQUE: Multidetector CT imaging of the chest was performed during intravenous contrast administration. RADIATION DOSE REDUCTION: This exam was performed according to the departmental dose-optimization program which includes automated exposure control, adjustment of the mA and/or kV according to patient size and/or use of iterative reconstruction technique. CONTRAST:  29m OMNIPAQUE  IOHEXOL 300 MG/ML  SOLN COMPARISON:  08/12/2022 CT abdomen/pelvis. FINDINGS: Cardiovascular: Normal heart size. No significant pericardial effusion/thickening. Three-vessel coronary atherosclerosis. Atherosclerotic nonaneurysmal thoracic aorta. Normal caliber pulmonary arteries. No central pulmonary emboli. Mediastinum/Nodes: No significant thyroid nodules. Unremarkable esophagus. No pathologically enlarged axillary, mediastinal or hilar lymph nodes. Lungs/Pleura: No pneumothorax. No pleural effusion. No acute consolidative airspace disease or lung masses. Solid anterior right middle lobe 0.3 cm pulmonary nodule (series 3/image 92). No additional significant pulmonary nodules. Upper abdomen: Nonobstructing 3 mm upper left renal stone. Musculoskeletal:  No aggressive appearing focal osseous lesions. IMPRESSION: 1. No lymphadenopathy or other findings highly suspicious for metastatic disease in the chest. 2. Solitary 0.3 cm right middle lobe solid pulmonary nodule. Recommend attention on follow-up noncontrast chest CT in 3 months. 3. Three-vessel coronary atherosclerosis. 4. Nonobstructing left nephrolithiasis. 5.  Aortic Atherosclerosis (ICD10-I70.0). Electronically Signed   By: JIlona SorrelM.D.   On: 08/20/2022 09:00   MR ABDOMEN WWO CONTRAST  Result Date: 08/20/2022 CLINICAL DATA:  Cancer risk  colonic polyp. Indeterminate right renal lesion on recent staging CT. EXAM: MRI ABDOMEN WITHOUT AND WITH CONTRAST TECHNIQUE: Multiplanar multisequence MR imaging of the abdomen was performed both before and after the administration of intravenous contrast. CONTRAST:  64m GADAVIST GADOBUTROL 1 MMOL/ML IV SOLN COMPARISON:  08/12/2022 CT abdomen/pelvis. FINDINGS: Lower chest: No acute abnormality at the lung bases. Hepatobiliary: Normal liver size and configuration. No hepatic steatosis. No liver mass. Normal gallbladder with no cholelithiasis. No biliary ductal dilatation. Common bile duct diameter 4 mm. No  choledocholithiasis. No biliary masses, strictures or beading. Pancreas: No pancreatic mass or duct dilation.  No pancreas divisum. Spleen: Normal size. No mass. Adrenals/Urinary Tract: Normal adrenals. No hydronephrosis. Simple 1.3 x 0.8 cm lateral upper right renal cyst (series 4/image 22) and a few scattered small simple parapelvic renal cysts bilaterally. No suspicious renal masses. Stomach/Bowel: Normal non-distended stomach. Visualized small and large bowel is normal caliber, with no bowel wall thickening. Vascular/Lymphatic: Atherosclerotic nonaneurysmal abdominal aorta. Patent portal, splenic, hepatic and renal veins. No pathologically enlarged lymph nodes in the abdomen. Other: No abdominal ascites or focal fluid collection. Musculoskeletal: No aggressive appearing focal osseous lesions. IMPRESSION: No suspicious renal masses. Small simple bilateral renal cysts, for which no follow-up imaging is recommended. No evidence of metastatic disease in the abdomen. Electronically Signed   By: JIlona SorrelM.D.   On: 08/20/2022 08:47   CT ABDOMEN PELVIS W CONTRAST  Result Date: 08/13/2022 CLINICAL DATA:  Colonoscopy last week that showed a cancerous polyp; staging exam; asymptomatic; c section; no other cancer diagnosis EXAM: CT ABDOMEN AND PELVIS WITH CONTRAST TECHNIQUE: Multidetector CT imaging of the abdomen and pelvis was performed using the standard protocol following bolus administration of intravenous contrast. RADIATION DOSE REDUCTION: This exam was performed according to the departmental dose-optimization program which includes automated exposure control, adjustment of the mA and/or kV according to patient size and/or use of iterative reconstruction technique. CONTRAST:  1061mOMNIPAQUE IOHEXOL 300 MG/ML  SOLN COMPARISON:  None Available. FINDINGS: Lower chest: Left anterior descending coronary calcification. No acute abnormality. Hepatobiliary: No focal liver abnormality. No gallstones, gallbladder  wall thickening, or pericholecystic fluid. No biliary dilatation. Pancreas: No focal lesion. Normal pancreatic contour. No surrounding inflammatory changes. No main pancreatic ductal dilatation. Spleen: Normal in size without focal abnormality. Adrenals/Urinary Tract: No adrenal nodule bilaterally. Bilateral kidneys enhance symmetrically. 3 mm calcified stone within the left kidney. No hydronephrosis. No hydroureter. There is a 1.3 cm right renal lesion with a density of 38 Hounsfield units. The urinary bladder is unremarkable. Stomach/Bowel: PO contrast reaches the distal descending colon. Stomach is within normal limits. No evidence of bowel wall thickening or dilatation. Appendix appears normal. Vascular/Lymphatic: No abdominal aorta or iliac aneurysm. Moderate atherosclerotic plaque of the aorta and its branches. No abdominal, pelvic, or inguinal lymphadenopathy. Reproductive: Uterus and bilateral adnexa are unremarkable. Other: No intraperitoneal free fluid. No intraperitoneal free gas. No organized fluid collection. Musculoskeletal: No abdominal wall hernia or abnormality. Diffusely decreased bone density. No suspicious lytic or blastic osseous lesions. No acute displaced fracture. L5-S1 intervertebral disc space vacuum phenomenon. IMPRESSION: 1. Nonobstructive 3 mm left nephrolithiasis. 2. Indeterminate 1.3 cm right renal lesion. When the patient is clinically stable and able to follow directions and hold their breath (preferably as an outpatient) further evaluation with dedicated MRI renal protocol should be considered. 3. Colonic diverticulosis with no acute diverticulae Electronically Signed   By: MoIven Finn.D.   On: 08/13/2022 21:53    ASSESSMENT: Pathologic stage IIa  adenocarcinoma of the ascending colon without high risk features.  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  MRI results from August 19, 2022 reviewed independently and report as above with no obvious evidence of metastatic disease.  Patient underwent partial hemicolectomy on August 21, 2022 revealed the above-stated malignancy.  Given no high risk features, adjuvant chemotherapy is not necessary.  CEA is less than 1.  Patient will require repeat colonoscopy within 1 year.  No further intervention is needed.  Return to clinic in 3 months with repeat laboratory work and further evaluation. Pulmonary nodule: Patient incidentally noted to have a solitary 0.3 cm right middle lobe pulmonary nodule too small to characterize.  We will repeat CT scan of chest in 3 months to assess for any interval change.  I spent a total of 45 minutes reviewing chart data, face-to-face evaluation with the patient, counseling and coordination of care as detailed above.   Patient expressed understanding and was in agreement with this plan. She also understands that She can call clinic at any time with any questions, concerns, or complaints.    Cancer Staging  Colon cancer [facility]) Staging form: Colon and Rectum, AJCC 8th Edition - Clinical stage from 08/29/2022: Stage IIA (ycT3, cN0, cM0) - Signed by Lloyd Huger, MD on 08/29/2022 Stage prefix: Post-therapy Response to neoadjuvant therapy: Complete response Total positive nodes: 0 Total nodes examined: 26   Lloyd Huger, MD   08/30/2022 8:51 AM

## 2022-09-02 LAB — SURGICAL PATHOLOGY

## 2022-09-09 ENCOUNTER — Other Ambulatory Visit: Payer: Self-pay | Admitting: Internal Medicine

## 2022-09-09 DIAGNOSIS — Z1231 Encounter for screening mammogram for malignant neoplasm of breast: Secondary | ICD-10-CM

## 2022-10-01 ENCOUNTER — Ambulatory Visit
Admission: RE | Admit: 2022-10-01 | Discharge: 2022-10-01 | Disposition: A | Discharge status: Home / Self Care | Payer: Medicare PPO | Source: Ambulatory Visit | Attending: Internal Medicine | Admitting: Internal Medicine

## 2022-10-01 DIAGNOSIS — Z1231 Encounter for screening mammogram for malignant neoplasm of breast: Secondary | ICD-10-CM | POA: Diagnosis not present

## 2022-10-17 IMAGING — MG MM DIGITAL SCREENING BILAT W/ TOMO AND CAD
6 of 10 series · 6 of 30 positions shown · non-contrast
Comparison: Previous exam(s).

CLINICAL DATA: Screening.

EXAM:
DIGITAL SCREENING BILATERAL MAMMOGRAM WITH TOMOSYNTHESIS AND CAD
TECHNIQUE: Bilateral screening digital craniocaudal and mediolateral oblique
mammograms were obtained. Bilateral screening digital breast
tomosynthesis was performed. The images were evaluated with
computer-aided detection.

[L CC synth-2D]
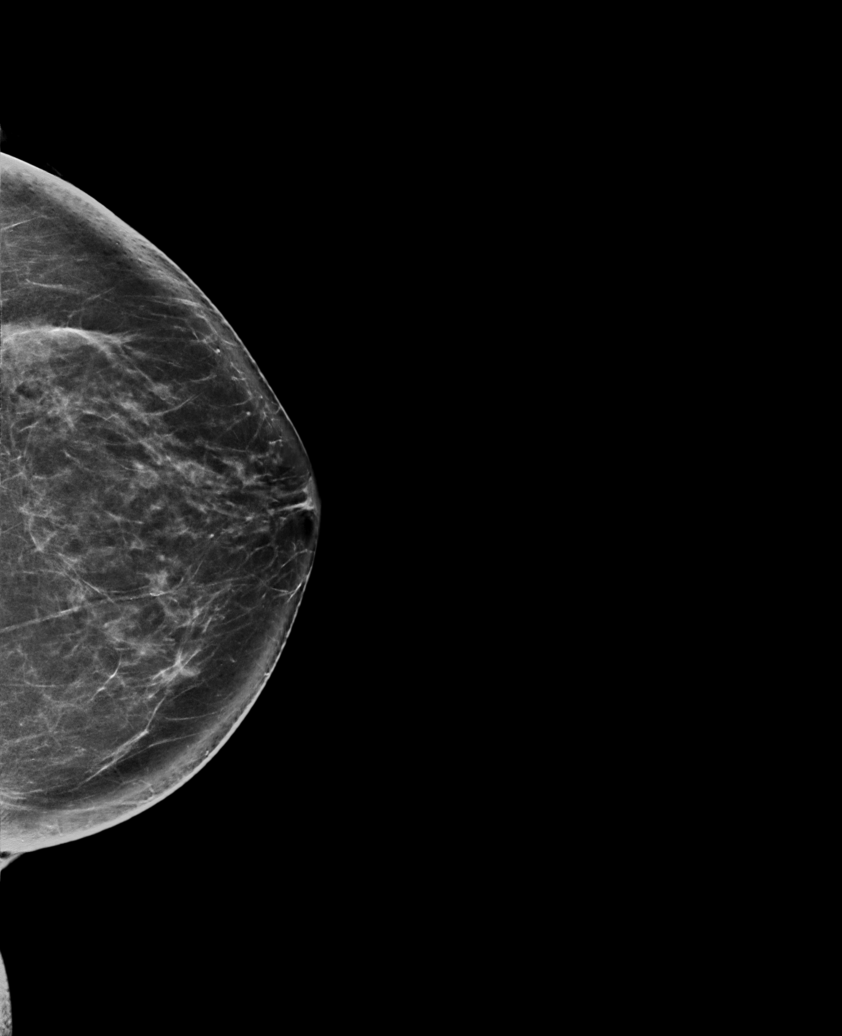

[R XCCL synth-2D]
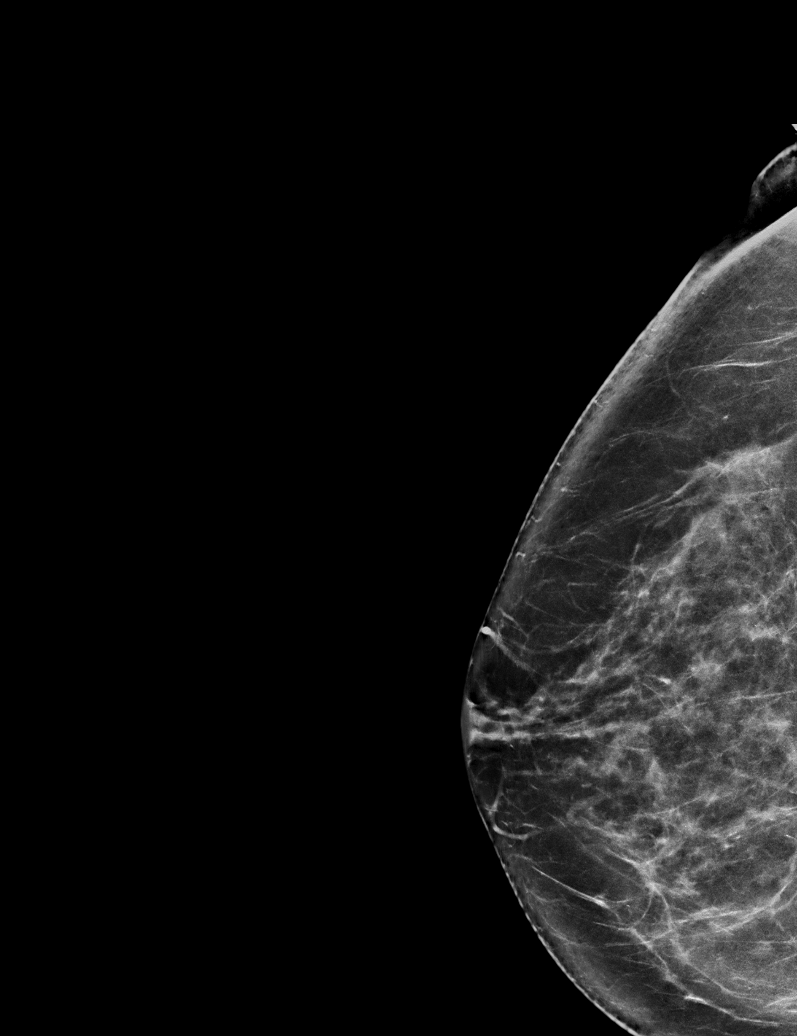

[R MLO synth-2D]
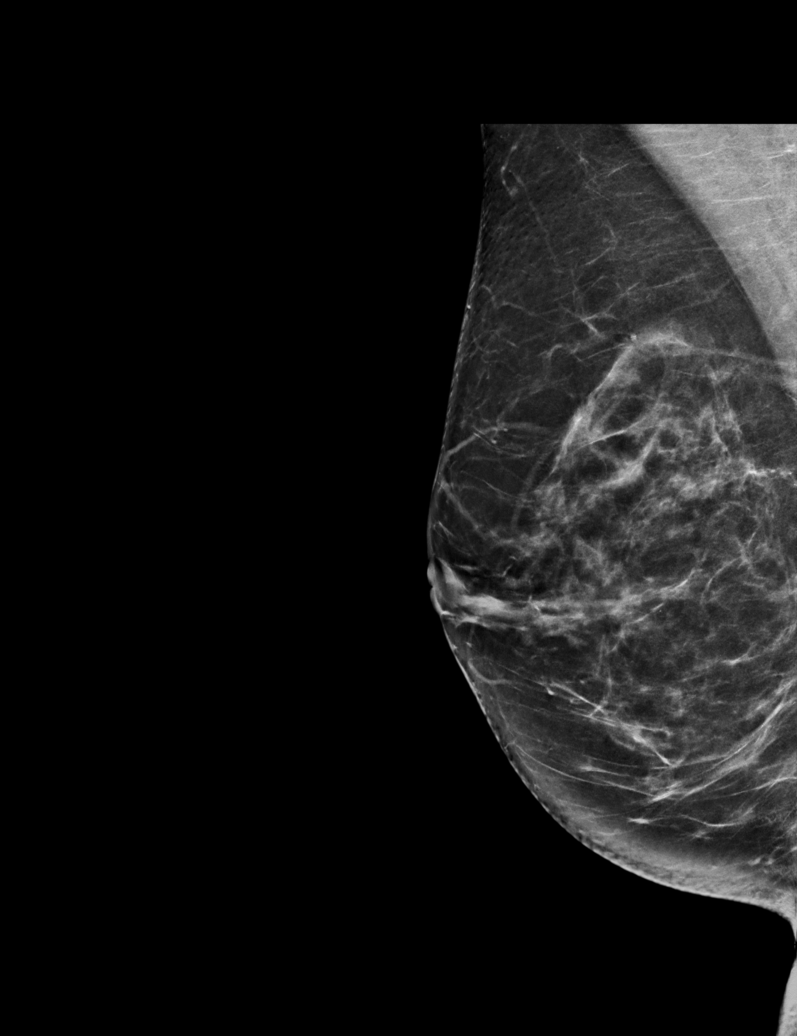

[R CC synth-2D]
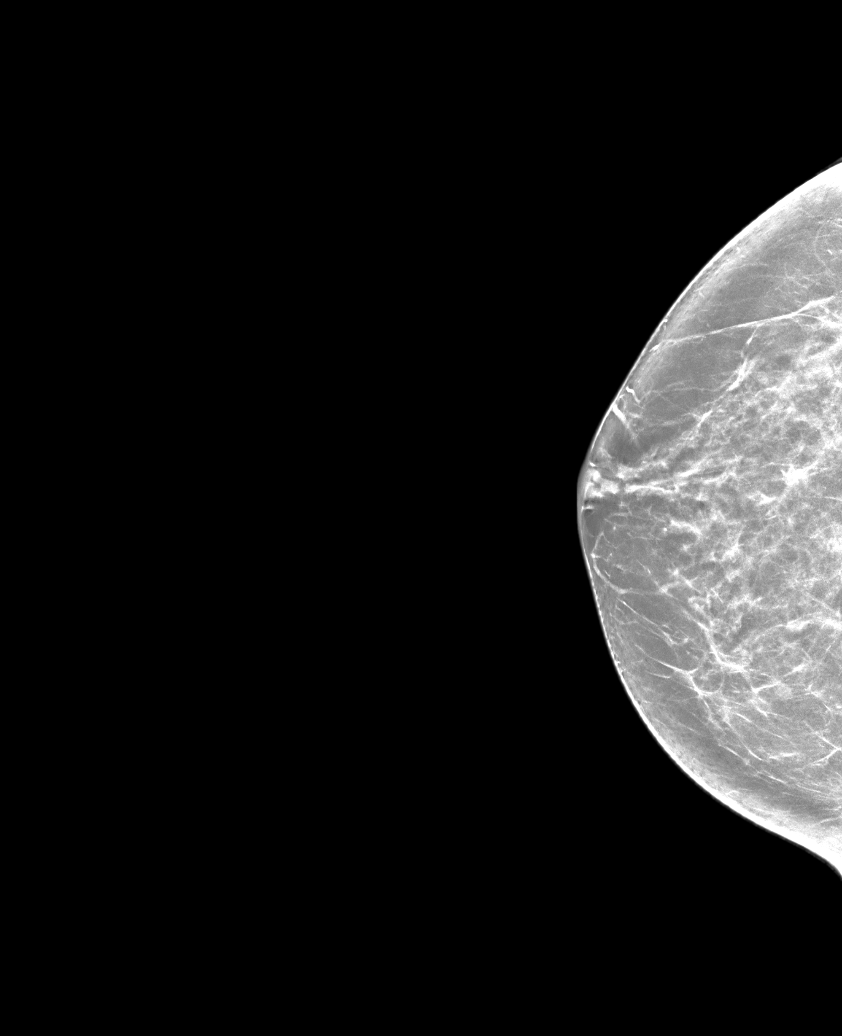

[L MLO synth-2D]
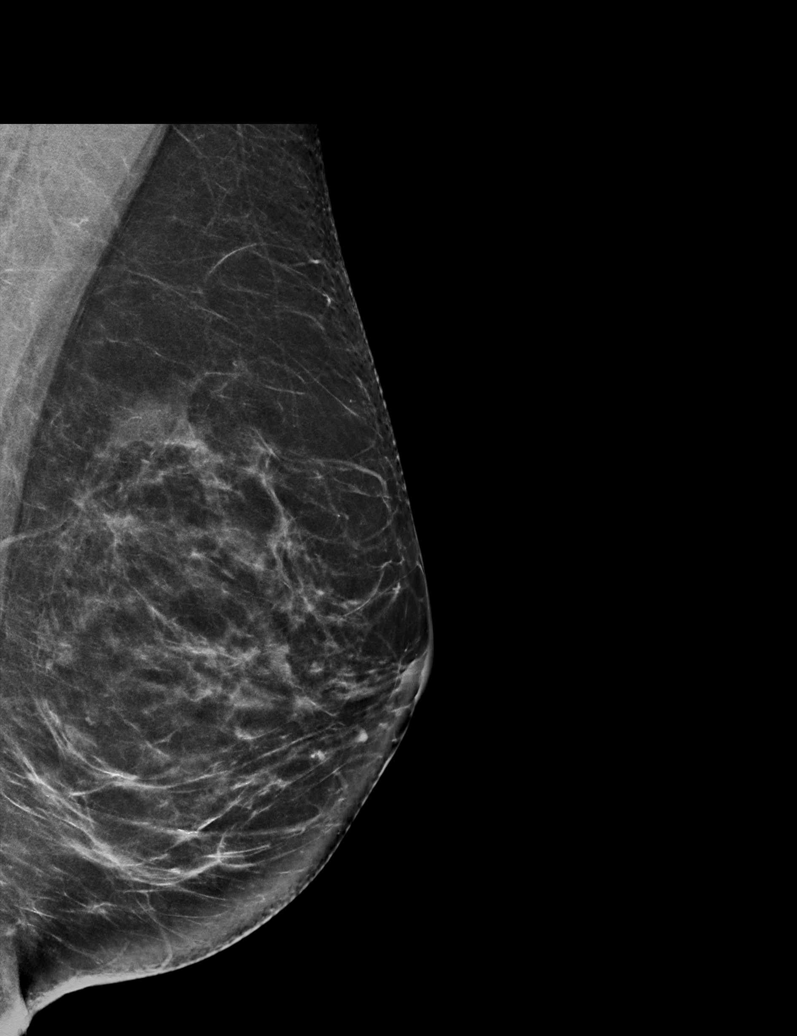

[R CC tomo · tomo slice 37/72.0]
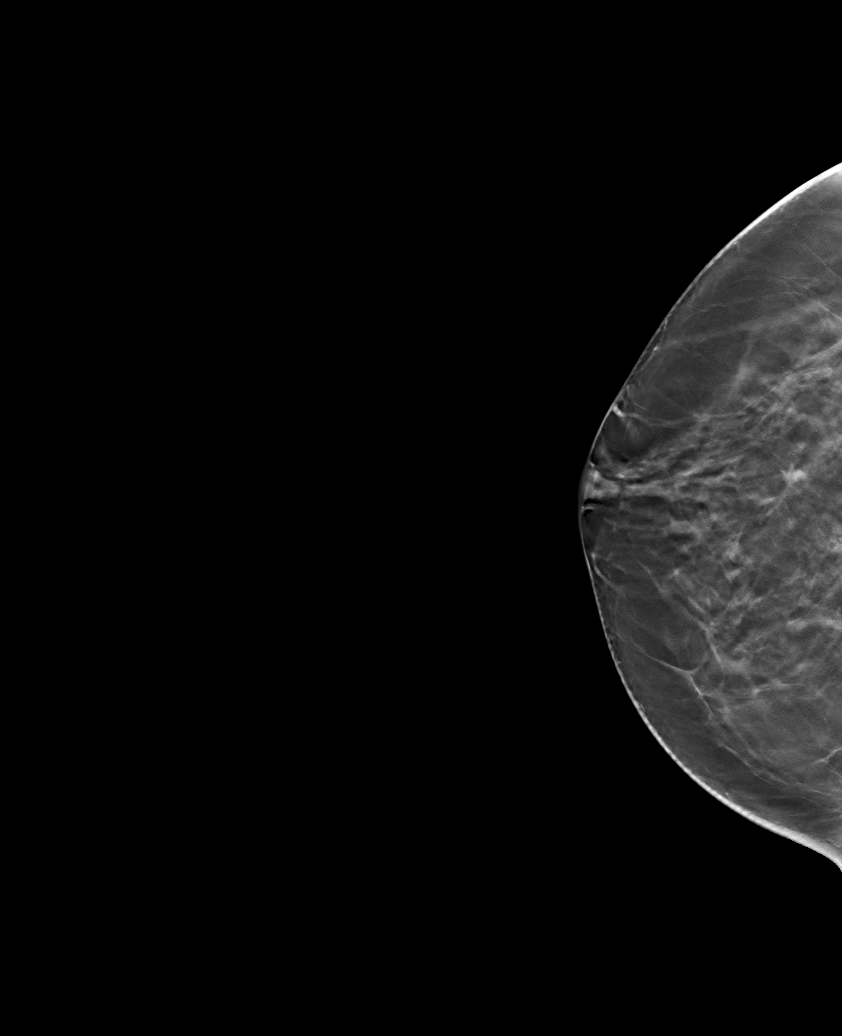

[6 of 30 positions shown; findings below may reference images not displayed]

ACR Breast Density Category c: The breast tissue is heterogeneously
dense, which may obscure small masses.
FINDINGS: There are no findings suspicious for malignancy.
IMPRESSION: No mammographic evidence of malignancy. A result letter of this
screening mammogram will be mailed directly to the patient.

RECOMMENDATION:
Screening mammogram in one year. (Code:Q3-W-BC3)

BI-RADS CATEGORY  1: Negative.

## 2022-10-28 NOTE — Progress Notes (Unsigned)
No chief complaint on file.   HPI:      [name] is a 67 y.o. [phone] who LMP was No LMP recorded. Patient is postmenopausal., presents today for her MEDICARE annual examination.  Her menses are absent and she is postmenopausal. She does not have PMB. She does have tolerable vasomotor sx.  Sex activity: single partner, contraception - post menopausal status. She does have vaginal dryness and uses lubricants with relief.  Last Pap: 09/04/21  Results were: no abnormalities /neg HPV DNA.  Hx of STDs: none  Last mammogram: 10/01/22 with PCP;  Results were: normal--routine follow-up in 12 months. Has appt 12/22 There is no FH of breast cancer. There is no FH of ovarian cancer. The patient does do self-breast exams.  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 Repeat due after 1 yr. DEXA: 09/25/21 at [facility] with osteopenia in spine/hip. Repeat due after 2 yrs.  Tobacco use: The patient denies current or previous tobacco use. Alcohol use: none  No drug use.  Exercise: moderately active  She does get adequate calcium and Vitamin D in her diet.  Labs with PCP.   Past Medical History:  Diagnosis Date   Abnormal uterine bleeding    Anemia    B12 deficiency    Fibrocystic disease of both breasts    History of mammogram 05/12/15; 06/03/16   BIRAD 2; NEG   History of Papanicolaou smear of cervix 05/12/2015; 05/14/2016   NEG; -/-   Lyme disease 2018   Malignant neoplasm of ascending colon ([place]) 07/2022   Osteoarthritis    Osteopenia    Solitary cyst of breast    NEG    Past Surgical History:  Procedure Laterality Date   BREAST CYST ASPIRATION Bilateral 07/29/97;02/28/00;111/4/00   RT; LT; LT; RT   [place]   COLONOSCOPY  04/10/2007   COLONOSCOPY  08/15/2017   COLONOSCOPY  08/05/2022   ENDOMETRIAL BIOPSY  11/26/2005   EARLY SECRETORY ENDOMETRIUM C  SLIGHTLY DISORDERED PATTERN    Family History  Problem Relation Age of Onset   Coronary artery disease Mother    Cerebrovascular Accident Mother    Hyperlipidemia Mother    Hypertension Mother    Chronic Renal Failure Mother    Hypercholesterolemia Mother    Transient ischemic attack Father 58   Breast cancer Neg Hx     Social History   Socioeconomic History   Marital status: Married    Spouse name: Brianna Dickerson   Number of children: 3   Years of education: 16   Highest education level: Not on file  Occupational History   Occupation: TEACHER  Tobacco Use   Smoking status: Never   Smokeless tobacco: Never  Vaping Use   Vaping Use: Never used  Substance and Sexual Activity   Alcohol use: No   Drug use: No   Sexual activity: Yes    Birth control/protection: Post-menopausal  Other Topics Concern   Not on file  Social History Narrative   Not on file   Social Determinants of Health   Financial Resource Strain: Not on file  Food Insecurity: No Food Insecurity (08/21/2022)   Hunger Vital Sign    Worried About Running Out of Food in the Last Year: Never true    Ran Out of Food in the Last Year: Never true  Transportation Needs: No Transportation Needs (08/21/2022)   PRAPARE - Transportation  Lack of Transportation (Medical): No    Lack of Transportation (Non-Medical): No  Physical Activity: Not on file  Stress: Not on file  Social Connections: Not on file  Intimate Partner Violence: Not At Risk (08/21/2022)   Humiliation, Afraid, Rape, and Kick questionnaire    Fear of Current or Ex-Partner: No    Emotionally Abused: No    Physically Abused: No    Sexually Abused: No     Current Outpatient Medications:    Ascorbic Acid (VITAMIN C) 100 MG tablet, Take 100 mg by mouth daily., Disp: , Rfl:    aspirin 81 MG chewable tablet, Chew 81 mg by mouth daily., Disp: , Rfl:    Calcium Carbonate (CALCIUM 600 PO), Take 1 tablet by mouth 2 (two) times daily., Disp: , Rfl:     Cholecalciferol (VITAMIN D3) 1000 units CAPS, Take 1 capsule by mouth daily., Disp: , Rfl:    Cyanocobalamin (VITAMIN B-12) 5000 MCG SUBL, Place 5,000 mcg under the tongue daily., Disp: , Rfl:    loratadine (CLARITIN) 10 MG tablet, Take 10 mg by mouth daily as needed for allergies., Disp: , Rfl:    Multiple Vitamins-Minerals (EMERGEN-C VITAMIN C PO), Take 1 tablet by mouth daily as needed., Disp: , Rfl:    Pediatric Multivit-Minerals (FLINTSTONES GUMMIES PO), Take 2 tablets by mouth daily., Disp: , Rfl:    ROS:  Review of Systems  Constitutional:  Negative for fatigue, fever and unexpected weight change.  Respiratory:  Negative for cough, shortness of breath and wheezing.   Cardiovascular:  Negative for chest pain, palpitations and leg swelling.  Gastrointestinal:  Negative for blood in stool, constipation, diarrhea, nausea and vomiting.  Endocrine: Negative for cold intolerance, heat intolerance and polyuria.  Genitourinary:  Negative for dyspareunia, dysuria, flank pain, frequency, genital sores, hematuria, menstrual problem, pelvic pain, urgency, vaginal bleeding, vaginal discharge and vaginal pain.  Musculoskeletal:  Negative for back pain, joint swelling and myalgias.  Skin:  Negative for rash.  Neurological:  Negative for dizziness, syncope, light-headedness, numbness and headaches.  Hematological:  Negative for adenopathy.  Psychiatric/Behavioral:  Negative for agitation, confusion, sleep disturbance and suicidal ideas. The patient is not nervous/anxious.      Objective: There were no vitals taken for this visit.   Physical Exam Constitutional:      Appearance: She is well-developed.  Genitourinary:     Vulva normal.     Right Labia: No rash, tenderness or lesions.    Left Labia: No tenderness, lesions or rash.    No vaginal discharge, erythema or tenderness.      Right Adnexa: not tender and no mass present.    Left Adnexa: not tender and no mass present.    No cervical  motion tenderness, friability or polyp.     Uterus is not enlarged or tender.  Breasts:    Right: No mass, nipple discharge, skin change or tenderness.     Left: No mass, nipple discharge, skin change or tenderness.  Neck:     Thyroid: No thyromegaly.  Cardiovascular:     Rate and Rhythm: Normal rate and regular rhythm.     Heart sounds: Normal heart sounds. No murmur heard. Pulmonary:     Effort: Pulmonary effort is normal.     Breath sounds: Normal breath sounds.  Abdominal:     Palpations: Abdomen is soft.     Tenderness: There is no abdominal tenderness. There is no guarding or rebound.  Musculoskeletal:  General: Normal range of motion.     Cervical back: Normal range of motion.  Lymphadenopathy:     Cervical: No cervical adenopathy.  Neurological:     General: No focal deficit present.     Mental Status: She is alert and oriented to person, place, and time.     Cranial Nerves: No cranial nerve deficit.  Skin:    General: Skin is warm and dry.  Psychiatric:        Mood and Affect: Mood normal.        Behavior: Behavior normal.        Thought Content: Thought content normal.        Judgment: Judgment normal.  Vitals reviewed.     Assessment/Plan:  Encounter for annual routine gynecological examination  Cervical cancer screening - Plan: Cytology - PAP  Screening for HPV (human papillomavirus) - Plan: Cytology - PAP  Encounter for screening mammogram for malignant neoplasm of breast; pt has mammo appt  Screening for osteoporosis - Plan: DG Bone Density; pt to sched [facility] with mammo  Need for immunization against influenza - Plan: Flu Vaccine QUAD 53moIM (Fluarix, Fluzone & Alfiuria Quad PF)           GYN counsel mammography screening, adequate intake of calcium and vitamin D, diet and exercise    F/U  No follow-ups on file.  Kailin Leu B. Dadrian Ballantine, PA-C 10/28/2022 7:03 PM

## 2022-10-29 ENCOUNTER — Ambulatory Visit (INDEPENDENT_AMBULATORY_CARE_PROVIDER_SITE_OTHER): Payer: Medicare PPO | Admitting: Obstetrics and Gynecology

## 2022-10-29 ENCOUNTER — Encounter: Payer: Self-pay | Admitting: Obstetrics and Gynecology

## 2022-10-29 VITALS — BP 130/74 | Ht 66.0 in | Wt 153.0 lb

## 2022-10-29 DIAGNOSIS — Z01419 Encounter for gynecological examination (general) (routine) without abnormal findings: Secondary | ICD-10-CM

## 2022-10-29 DIAGNOSIS — Z1231 Encounter for screening mammogram for malignant neoplasm of breast: Secondary | ICD-10-CM

## 2022-10-29 DIAGNOSIS — C182 Malignant neoplasm of ascending colon: Secondary | ICD-10-CM

## 2022-10-29 NOTE — Patient Instructions (Signed)
I value your feedback and you entrusting us with your care. If you get a [facility] patient survey, I would appreciate you taking the time to let us know about your experience today. Thank you! ? ? ?

## 2022-11-26 ENCOUNTER — Ambulatory Visit
Admission: RE | Admit: 2022-11-26 | Discharge: 2022-11-26 | Disposition: A | Discharge status: Home / Self Care | Payer: Medicare PPO | Source: Ambulatory Visit | Attending: Oncology | Admitting: Oncology

## 2022-11-26 DIAGNOSIS — C182 Malignant neoplasm of ascending colon: Secondary | ICD-10-CM

## 2022-11-26 MED ORDER — IOHEXOL 300 MG/ML  SOLN
75.0000 mL | Freq: Once | INTRAMUSCULAR | Status: AC | PRN
  Administered 2022-11-26: 75 mL via INTRAVENOUS

## 2022-12-02 ENCOUNTER — Other Ambulatory Visit: Payer: Self-pay | Admitting: *Deleted

## 2022-12-02 DIAGNOSIS — C182 Malignant neoplasm of ascending colon: Secondary | ICD-10-CM

## 2022-12-03 ENCOUNTER — Inpatient Hospital Stay: Payer: Medicare PPO | Attending: Oncology

## 2022-12-03 ENCOUNTER — Encounter: Payer: Self-pay | Admitting: Oncology

## 2022-12-03 ENCOUNTER — Inpatient Hospital Stay: Payer: Medicare PPO | Admitting: Oncology

## 2022-12-03 VITALS — BP 129/72 | HR 82 | Temp 97.9°F | Resp 16 | Ht 66.0 in | Wt 151.9 lb

## 2022-12-03 DIAGNOSIS — R911 Solitary pulmonary nodule: Secondary | ICD-10-CM | POA: Insufficient documentation

## 2022-12-03 DIAGNOSIS — C182 Malignant neoplasm of ascending colon: Secondary | ICD-10-CM

## 2022-12-03 LAB — CBC WITH DIFFERENTIAL/PLATELET
Abs Immature Granulocytes: 0.03 10*3/uL (ref 0.00–0.07)
Basophils Absolute: 0.1 10*3/uL (ref 0.0–0.1)
Basophils Relative: 1 %
Eosinophils Absolute: 0.2 10*3/uL (ref 0.0–0.5)
Eosinophils Relative: 3 %
HCT: 39.3 % (ref 36.0–46.0)
Hemoglobin: 13 g/dL (ref 12.0–15.0)
Immature Granulocytes: 0 %
Lymphocytes Relative: 25 %
Lymphs Abs: 2 10*3/uL (ref 0.7–4.0)
MCH: 27.7 pg (ref 26.0–34.0)
MCHC: 33.1 g/dL (ref 30.0–36.0)
MCV: 83.6 fL (ref 80.0–100.0)
Monocytes Absolute: 0.6 10*3/uL (ref 0.1–1.0)
Monocytes Relative: 7 %
Neutro Abs: 5 10*3/uL (ref 1.7–7.7)
Neutrophils Relative %: 64 %
Platelets: 227 10*3/uL (ref 150–400)
RBC: 4.7 MIL/uL (ref 3.87–5.11)
RDW: 14 % (ref 11.5–15.5)
WBC: 7.8 10*3/uL (ref 4.0–10.5)
nRBC: 0 % (ref 0.0–0.2)

## 2022-12-03 NOTE — Progress Notes (Signed)
[facility]  [phone] [phone]  ID: RONDI MCNETT OB: 15-Sep-1956  MR#: [id number]  [id number]  Patient Care Team: Rusty Aus, MD as PCP - General (Internal Medicine) Clent Jacks, RN as Oncology Nurse Navigator  CHIEF COMPLAINT: Pathologic stage IIa adenocarcinoma of the ascending colon without high risk features.  INTERVAL HISTORY: Patient returns to clinic today for routine 67-monthevaluation and discussion of her imaging results.  She currently feels well and is asymptomatic.  She has no neurologic complaints.  She denies any recent fevers or illnesses.  She has a good appetite and denies weight loss.  She has no chest pain, shortness of breath, cough, or hemoptysis.  She denies any nausea, vomiting, constipation, or diarrhea.  She has no melena or hematochezia.  She has no urinary complaints.  Patient offers no specific complaints today.    REVIEW OF SYSTEMS:   Review of Systems  Constitutional: Negative.  Negative for fever, malaise/fatigue and weight loss.  Respiratory: Negative.  Negative for cough, hemoptysis and shortness of breath.   Cardiovascular: Negative.  Negative for chest pain and leg swelling.  Gastrointestinal: Negative.  Negative for abdominal pain, blood in stool and melena.  Genitourinary: Negative.  Negative for dysuria.  Musculoskeletal: Negative.  Negative for back pain.  Skin: Negative.  Negative for rash.  Neurological: Negative.  Negative for dizziness, focal weakness, weakness and headaches.  Psychiatric/Behavioral: Negative.  The patient is not nervous/anxious.     As per HPI. Otherwise, a complete review of systems is negative.  PAST MEDICAL HISTORY: Past Medical History:  Diagnosis Date   Abnormal uterine bleeding    Anemia    B12 deficiency    Fibrocystic disease of both breasts    History of mammogram 05/12/15; 06/03/16   BIRAD 2; NEG   History of Papanicolaou smear of cervix 05/12/2015;  05/14/2016   NEG; -/-   Lyme disease 2018   [place]   Osteoarthritis    Osteopenia    Solitary cyst of breast    NEG    PAST SURGICAL HISTORY: Past Surgical History:  Procedure Laterality Date   BREAST CYST ASPIRATION Bilateral 07/29/97;02/28/00;111/4/00   RT; LT; LT; RT   [place]  COLONOSCOPY  04/10/2007   COLONOSCOPY  08/15/2017   COLONOSCOPY  08/05/2022   ENDOMETRIAL BIOPSY  11/26/2005   EARLY SECRETORY ENDOMETRIUM C SLIGHTLY DISORDERED PATTERN    FAMILY HISTORY: Family History  Problem Relation Age of Onset   Coronary artery disease Mother    Cerebrovascular Accident Mother    Hyperlipidemia Mother    Hypertension Mother    Chronic Renal Failure Mother    Hypercholesterolemia Mother    Transient ischemic attack Father 861  Breast cancer Neg Hx     ADVANCED DIRECTIVES (Y/N):  N  HEALTH MAINTENANCE: Social History   Tobacco Use   Smoking status: Never   Smokeless tobacco: Never  Vaping Use   Vaping Use: Never used  Substance Use Topics   Alcohol use: No   Drug use: No     Colonoscopy:  PAP:  Bone density:  Lipid panel:  Allergies  Allergen Reactions   Shellfish Allergy Nausea And Vomiting and Other (See Comments)    Shrimp    Current Outpatient Medications  Medication Sig Dispense Refill   acyclovir cream (ZOVIRAX) 5 % Apply topically.     Ascorbic Acid (VITAMIN C) 100 MG tablet Take 100 mg by  mouth daily.     aspirin 81 MG chewable tablet Chew 81 mg by mouth daily.     Calcium Carbonate (CALCIUM 600 PO) Take 1 tablet by mouth 2 (two) times daily.     Cholecalciferol (VITAMIN D3) 1000 units CAPS Take 1 capsule by mouth daily.     Cyanocobalamin (VITAMIN B-12) 5000 MCG SUBL Place 5,000 mcg under the tongue daily.     loratadine (CLARITIN) 10 MG tablet Take 10 mg by mouth daily as needed for allergies.     Multiple Vitamins-Minerals (EMERGEN-C VITAMIN C PO) Take 1 tablet by mouth daily as  needed.     Pediatric Multivit-Minerals (FLINTSTONES GUMMIES PO) Take 2 tablets by mouth daily.     No current facility-administered medications for this visit.    OBJECTIVE: Vitals:   12/03/22 1336  BP: 129/72  Pulse: 82  Resp: 16  Temp: 97.9 F (36.6 C)  SpO2: 100%     Body mass index is 24.52 kg/m.    ECOG FS:0 - Asymptomatic  General: Well-developed, well-nourished, no acute distress. Eyes: Pink conjunctiva, anicteric sclera. HEENT: Normocephalic, moist mucous membranes. Lungs: No audible wheezing or coughing. Heart: Regular rate and rhythm. Abdomen: Soft, nontender, no obvious distention. Musculoskeletal: No edema, cyanosis, or clubbing. Neuro: Alert, answering all questions appropriately. Cranial nerves grossly intact. Skin: No rashes or petechiae noted. Psych: Normal affect.  LAB RESULTS:  Lab Results  Component Value Date   NA 141 08/23/2022   K 4.3 08/23/2022   CL 112 (H) 08/23/2022   CO2 25 08/23/2022   GLUCOSE 97 08/23/2022   BUN 14 08/23/2022   CREATININE 0.73 08/23/2022   CALCIUM 8.2 (L) 08/23/2022   GFRNONAA >60 08/23/2022    Lab Results  Component Value Date   WBC 7.8 12/03/2022   NEUTROABS 5.0 12/03/2022   HGB 13.0 12/03/2022   HCT 39.3 12/03/2022   MCV 83.6 12/03/2022   PLT 227 12/03/2022     STUDIES: CT Chest W Contrast  Result Date: 11/26/2022 CLINICAL DATA:  History of colon cancer with lung nodule. Post partial colonic resection in the past. * Tracking Code: BO * EXAM: CT CHEST WITH CONTRAST TECHNIQUE: Multidetector CT imaging of the chest was performed during intravenous contrast administration. RADIATION DOSE REDUCTION: This exam was performed according to the departmental dose-optimization program which includes automated exposure control, adjustment of the mA and/or kV according to patient size and/or use of iterative reconstruction technique. CONTRAST:  29m OMNIPAQUE IOHEXOL 300 MG/ML  SOLN COMPARISON:  August 19, 2022 FINDINGS:  Cardiovascular: Stable appearance of the heart great vessels with signs of aortic atherosclerosis and coronary artery disease. Mediastinum/Nodes: No thoracic inlet, axillary, mediastinal or hilar adenopathy. Esophagus grossly normal. Lungs/Pleura: No consolidation. No pleural effusion. Stable 3 mm RIGHT middle lobe pulmonary nodule. (Image 94/3) no new or progressive findings. Upper Abdomen: Incidental imaging of upper abdominal contents without acute process to the extent evaluated. Musculoskeletal: Osteopenia. Spinal degenerative changes. No acute or destructive bone process. IMPRESSION: 1. Stable 3 mm RIGHT middle lobe pulmonary nodule. 2. No new or progressive findings. 3. Aortic atherosclerosis and coronary artery disease. Aortic Atherosclerosis (ICD10-I70.0). Electronically Signed   By: GZetta BillsM.D.   On: 11/26/2022 12:14    ASSESSMENT: Pathologic stage IIa adenocarcinoma of the ascending colon without high risk features.  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  MRI  results from August 19, 2022 reviewed independently and report as above with no obvious evidence of metastatic disease.  Patient underwent partial hemicolectomy on August 21, 2022.  Given no high risk features, adjuvant chemotherapy was not necessary.  CEA is less than 1.  Today's result is pending.  Patient will require repeat colonoscopy within 1 year.  No further intervention is needed.  Return to clinic in 6 months with repeat imaging, laboratory work and video assisted telemedicine visit. Pulmonary nodule: Repeat CT scan on November 26, 2022 reviewed independently and reported as above with unchanged solitary 0.3 cm right middle lobe pulmonary nodule too small to characterize.  Repeat CT scan in 6 months.  If nodule is stable, no further imaging will be  necessary.   Patient expressed understanding and was in agreement with this plan. She also understands that She can call clinic at any time with any questions, concerns, or complaints.    Cancer Staging  Colon cancer [facility]) Staging form: Colon and Rectum, AJCC 8th Edition - Clinical stage from 08/29/2022: Stage IIA (ycT3, cN0, cM0) - Signed by Lloyd Huger, MD on 08/29/2022 Stage prefix: Post-therapy Response to neoadjuvant therapy: Complete response Total positive nodes: 0 Total nodes examined: 26   Lloyd Huger, MD   12/03/2022 2:28 PM

## 2022-12-05 LAB — CEA: CEA: 1.1 ng/mL (ref 0.0–4.7)

## 2022-12-12 ENCOUNTER — Telehealth: Payer: Self-pay | Admitting: *Deleted

## 2022-12-12 NOTE — Telephone Encounter (Signed)
Call placed to patient regarding clarification of scheduling of CT Chest/Abdomen/Pelvis. Per recommendation from Dr. Grayland Ormond patient needs follow up imaging 6 months after most recent Chest CT in February. Advised patient that Dr. Grayland Ormond does recommend proceeding with CT chest/abdomen/pelvis in August. Patient verbalized understanding and is in agreement with plan.

## 2022-12-19 ENCOUNTER — Encounter: Payer: Self-pay | Admitting: Radiology

## 2023-01-08 ENCOUNTER — Telehealth (HOSPITAL_COMMUNITY): Payer: Self-pay | Admitting: Emergency Medicine

## 2023-01-08 ENCOUNTER — Other Ambulatory Visit: Payer: Self-pay | Admitting: Physician Assistant

## 2023-01-08 ENCOUNTER — Encounter (HOSPITAL_COMMUNITY): Payer: Self-pay

## 2023-01-08 DIAGNOSIS — Z8249 Family history of ischemic heart disease and other diseases of the circulatory system: Secondary | ICD-10-CM

## 2023-01-08 DIAGNOSIS — R9439 Abnormal result of other cardiovascular function study: Secondary | ICD-10-CM

## 2023-01-08 DIAGNOSIS — R079 Chest pain, unspecified: Secondary | ICD-10-CM

## 2023-01-08 MED ORDER — IVABRADINE HCL 5 MG PO TABS: 10.0000 mg | ORAL_TABLET | Freq: Once | ORAL | 0 refills | Status: AC

## 2023-01-08 MED ORDER — METOPROLOL TARTRATE 100 MG PO TABS: 100.0000 mg | ORAL_TABLET | Freq: Once | ORAL | 0 refills | Status: DC

## 2023-01-08 NOTE — Telephone Encounter (Signed)
Attempted to call patient regarding upcoming cardiac CT appointment. Left message on voicemail with name and callback number Marchia Bond RN Navigator Cardiac Imaging [facility] [phone] Office [phone] Cell  100mg  metoprolol + 10mg  ivabradine sent to pharm on file- [facility] [place], [place]

## 2023-01-09 ENCOUNTER — Other Ambulatory Visit: Payer: Self-pay | Admitting: Physician Assistant

## 2023-01-09 ENCOUNTER — Ambulatory Visit
Admission: RE | Admit: 2023-01-09 | Discharge: 2023-01-09 | Disposition: A | Discharge status: Home / Self Care | Payer: Medicare PPO | Source: Ambulatory Visit | Attending: Physician Assistant | Admitting: Physician Assistant

## 2023-01-09 DIAGNOSIS — I2510 Atherosclerotic heart disease of native coronary artery without angina pectoris: Secondary | ICD-10-CM | POA: Diagnosis not present

## 2023-01-09 DIAGNOSIS — Z8249 Family history of ischemic heart disease and other diseases of the circulatory system: Secondary | ICD-10-CM

## 2023-01-09 DIAGNOSIS — R9439 Abnormal result of other cardiovascular function study: Secondary | ICD-10-CM

## 2023-01-09 DIAGNOSIS — R079 Chest pain, unspecified: Secondary | ICD-10-CM | POA: Insufficient documentation

## 2023-01-09 DIAGNOSIS — R931 Abnormal findings on diagnostic imaging of heart and coronary circulation: Secondary | ICD-10-CM | POA: Diagnosis present

## 2023-01-09 MED ORDER — IOHEXOL 350 MG/ML SOLN
75.0000 mL | Freq: Once | INTRAVENOUS | Status: AC | PRN
  Administered 2023-01-09: 75 mL via INTRAVENOUS

## 2023-01-09 MED ORDER — NITROGLYCERIN 0.4 MG SL SUBL
0.8000 mg | SUBLINGUAL_TABLET | Freq: Once | SUBLINGUAL | Status: AC
  Administered 2023-01-09: 0.8 mg via SUBLINGUAL

## 2023-01-09 NOTE — Progress Notes (Signed)
Patient tolerated CT well. Drank water after. Vital signs stable encourage to drink water throughout day.Reasons explained and verbalized understanding. Ambulated steady gait.  

## 2023-06-03 ENCOUNTER — Inpatient Hospital Stay: Payer: Medicare PPO | Attending: Oncology

## 2023-06-03 ENCOUNTER — Ambulatory Visit
Admission: RE | Admit: 2023-06-03 | Discharge: 2023-06-03 | Disposition: A | Discharge status: Home / Self Care | Payer: Medicare PPO | Source: Ambulatory Visit | Attending: Oncology | Admitting: Oncology

## 2023-06-03 DIAGNOSIS — R911 Solitary pulmonary nodule: Secondary | ICD-10-CM | POA: Diagnosis not present

## 2023-06-03 DIAGNOSIS — Z85038 Personal history of other malignant neoplasm of large intestine: Secondary | ICD-10-CM | POA: Diagnosis present

## 2023-06-03 DIAGNOSIS — C182 Malignant neoplasm of ascending colon: Secondary | ICD-10-CM

## 2023-06-03 LAB — CBC WITH DIFFERENTIAL/PLATELET
Abs Immature Granulocytes: 0.02 10*3/uL (ref 0.00–0.07)
Basophils Absolute: 0.1 10*3/uL (ref 0.0–0.1)
Basophils Relative: 1 %
Eosinophils Absolute: 0.2 10*3/uL (ref 0.0–0.5)
Eosinophils Relative: 4 %
HCT: 42 % (ref 36.0–46.0)
Hemoglobin: 13.7 g/dL (ref 12.0–15.0)
Immature Granulocytes: 0 %
Lymphocytes Relative: 26 %
Lymphs Abs: 1.6 10*3/uL (ref 0.7–4.0)
MCH: 29.3 pg (ref 26.0–34.0)
MCHC: 32.6 g/dL (ref 30.0–36.0)
MCV: 89.7 fL (ref 80.0–100.0)
Monocytes Absolute: 0.6 10*3/uL (ref 0.1–1.0)
Monocytes Relative: 9 %
Neutro Abs: 3.9 10*3/uL (ref 1.7–7.7)
Neutrophils Relative %: 60 %
Platelets: 221 10*3/uL (ref 150–400)
RBC: 4.68 MIL/uL (ref 3.87–5.11)
RDW: 12.5 % (ref 11.5–15.5)
WBC: 6.4 10*3/uL (ref 4.0–10.5)
nRBC: 0 % (ref 0.0–0.2)

## 2023-06-03 MED ORDER — IOHEXOL 300 MG/ML  SOLN
80.0000 mL | Freq: Once | INTRAMUSCULAR | Status: AC | PRN
  Administered 2023-06-03: 75 mL via INTRAVENOUS

## 2023-06-03 MED ORDER — BARIUM SULFATE 2 % PO SUSP
450.0000 mL | ORAL | Status: AC
  Administered 2023-06-03 (×2): 450 mL via ORAL

## 2023-06-10 ENCOUNTER — Inpatient Hospital Stay: Payer: Medicare PPO | Admitting: Oncology

## 2023-06-10 DIAGNOSIS — C182 Malignant neoplasm of ascending colon: Secondary | ICD-10-CM | POA: Diagnosis not present

## 2023-06-10 NOTE — Progress Notes (Unsigned)
[facility]  [phone] [phone]  ID: Brianna Dickerson OB: Jun 23, 1956  MR#: [id number]  QIO#:[id number]  Patient Care Team: Danella Penton, MD as PCP - General (Internal Medicine) Benita Gutter, RN as Oncology Nurse Navigator  I connected with Christiana Pellant on 06/11/23 at  3:30 PM EDT by video enabled telemedicine visit and verified that I am speaking with the correct person using two identifiers.   I discussed the limitations, risks, security and privacy concerns of performing an evaluation and management service by telemedicine and the availability of in-person appointments. I also discussed with the patient that there may be a patient responsible charge related to this service. The patient expressed understanding and agreed to proceed.   Other persons participating in the visit and their role in the encounter: Patient, MD.  Patient's location: Home. Provider's location: Clinic.  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  INTERVAL HISTORY: Patient agreed to video assisted telemedicine visit for routine 82-month evaluation and discussion of her imaging results.  She continues to feel well and remains asymptomatic. She has no neurologic complaints.  She denies any recent fevers or illnesses.  She has a good appetite and denies weight loss.  She has no chest pain, shortness of breath, cough, or hemoptysis.  She denies any nausea, vomiting, constipation, or diarrhea.  She has no melena or hematochezia.  She has no urinary complaints.  Patient feels at her baseline offers no specific complaints today.  REVIEW OF SYSTEMS:   Review of Systems  Constitutional: Negative.  Negative for fever, malaise/fatigue and weight loss.  Respiratory: Negative.  Negative for cough, hemoptysis and shortness of breath.   Cardiovascular: Negative.  Negative for chest pain and leg swelling.   Gastrointestinal: Negative.  Negative for abdominal pain, blood in stool and melena.  Genitourinary: Negative.  Negative for dysuria.  Musculoskeletal: Negative.  Negative for back pain.  Skin: Negative.  Negative for rash.  Neurological: Negative.  Negative for dizziness, focal weakness, weakness and headaches.  Psychiatric/Behavioral: Negative.  The patient is not nervous/anxious.     As per HPI. Otherwise, a complete review of systems is negative.  PAST MEDICAL HISTORY: Past Medical History:  Diagnosis Date   Abnormal uterine bleeding    Anemia    B12 deficiency    Fibrocystic disease of both breasts    History of mammogram 05/12/15; 06/03/16   BIRAD 2; NEG   History of Papanicolaou smear of cervix 05/12/2015; 05/14/2016   NEG; -/-   Lyme disease 2018   Malignant neoplasm of ascending colon (HCC) 07/2022   Osteoarthritis    Osteopenia    Solitary cyst of breast    NEG    PAST SURGICAL HISTORY: Past Surgical History:  Procedure Laterality Date   BREAST CYST ASPIRATION Bilateral 07/29/97;02/28/00;111/4/00   RT; LT; LT; RT   CESAREAN SECTION  1986   COLONOSCOPY  04/10/2007   COLONOSCOPY  08/15/2017   COLONOSCOPY  08/05/2022   ENDOMETRIAL BIOPSY  11/26/2005   EARLY SECRETORY ENDOMETRIUM C SLIGHTLY DISORDERED PATTERN    FAMILY HISTORY: Family History  Problem Relation Age of Onset   Coronary artery disease Mother    Cerebrovascular Accident Mother    Hyperlipidemia Mother    Hypertension Mother    Chronic Renal Failure Mother    Hypercholesterolemia Mother    Transient ischemic attack Father 74   Breast cancer Neg Hx     ADVANCED DIRECTIVES (Y/N):  N  HEALTH MAINTENANCE: Social History   Tobacco Use   Smoking status: Never   Smokeless tobacco: Never  Vaping Use   Vaping status: Never Used  Substance Use Topics   Alcohol use: No   Drug use: No     Colonoscopy:  PAP:  Bone density:  Lipid panel:  Allergies  Allergen Reactions   Shellfish Allergy  Nausea And Vomiting and Other (See Comments)    Shrimp    Current Outpatient Medications  Medication Sig Dispense Refill   acyclovir cream (ZOVIRAX) 5 % Apply topically.     Ascorbic Acid (VITAMIN C) 100 MG tablet Take 100 mg by mouth daily.     aspirin 81 MG chewable tablet Chew 81 mg by mouth daily.     Calcium Carbonate (CALCIUM 600 PO) Take 1 tablet by mouth 2 (two) times daily.     Cholecalciferol (VITAMIN D3) 1000 units CAPS Take 1 capsule by mouth daily.     Cyanocobalamin (VITAMIN B-12) 5000 MCG SUBL Place 5,000 mcg under the tongue daily.     loratadine (CLARITIN) 10 MG tablet Take 10 mg by mouth daily as needed for allergies.     metoprolol tartrate (LOPRESSOR) 100 MG tablet Take 1 tablet (100 mg total) by mouth once for 1 dose. Please take one time dose 100mg  metoprolol tartrate 2 hr prior to cardiac CT for HR control IF HR >55bpm. 1 tablet 0   Multiple Vitamins-Minerals (EMERGEN-C VITAMIN C PO) Take 1 tablet by mouth daily as needed.     Pediatric Multivit-Minerals (FLINTSTONES GUMMIES PO) Take 2 tablets by mouth daily.     No current facility-administered medications for this visit.    OBJECTIVE: There were no vitals filed for this visit.    There is no height or weight on file to calculate BMI.    ECOG FS:0 - Asymptomatic  General: Well-developed, well-nourished, no acute distress. HEENT: Normocephalic. Neuro: Alert, answering all questions appropriately. Cranial nerves grossly intact. Psych: Normal affect.  LAB RESULTS:  Lab Results  Component Value Date   NA 141 08/23/2022   K 4.3 08/23/2022   CL 112 (H) 08/23/2022   CO2 25 08/23/2022   GLUCOSE 97 08/23/2022   BUN 14 08/23/2022   CREATININE 0.73 08/23/2022   CALCIUM 8.2 (L) 08/23/2022   GFRNONAA >60 08/23/2022    Lab Results  Component Value Date   WBC 6.4 06/03/2023   NEUTROABS 3.9 06/03/2023   HGB 13.7 06/03/2023   HCT 42.0 06/03/2023   MCV 89.7 06/03/2023   PLT 221 06/03/2023     STUDIES: CT  CHEST ABDOMEN PELVIS W CONTRAST  Result Date: 06/03/2023 CLINICAL DATA:  Colon cancer diagnosed last October with partial resection. Asymptomatic. Partial hemicolectomy 08/21/2022. * Tracking Code: BO * EXAM: CT CHEST, ABDOMEN, AND PELVIS WITH CONTRAST TECHNIQUE: Multidetector CT imaging of the chest, abdomen and pelvis was performed following the standard protocol during bolus administration of intravenous contrast. RADIATION DOSE REDUCTION: This exam was performed according to the departmental dose-optimization program which includes automated exposure control, adjustment of the mA and/or kV according to patient size and/or use of iterative reconstruction technique. CONTRAST:  75mL OMNIPAQUE IOHEXOL 300 MG/ML  SOLN COMPARISON:  Chest CT 11/26/2022. Most recent abdominopelvic CT of 08/17/2022 FINDINGS: CT CHEST FINDINGS Cardiovascular: Aortic atherosclerosis. Normal heart size, without pericardial effusion. Lad coronary artery calcification. No central pulmonary embolism, on this non-dedicated study. Mediastinum/Nodes: No supraclavicular adenopathy. No mediastinal or hilar adenopathy. Lungs/Pleura: No pleural fluid. No change in a 3  mm right middle lobe pulmonary nodule on 94/3. No suspicious pulmonary nodules. Musculoskeletal: No acute osseous abnormality. CT ABDOMEN PELVIS FINDINGS Hepatobiliary: Normal liver. Normal gallbladder, without biliary ductal dilatation. Pancreas: Normal, without mass or ductal dilatation. Spleen: Normal in size, without focal abnormality. Adrenals/Urinary Tract: Normal adrenal glands. Suspect a punctate upper/interpolar left renal collecting system calculus. Bilateral too small to characterize renal lesions have been characterized on prior abdominal MRI and do not warrant imaging follow-up. No hydronephrosis. Normal urinary bladder. Stomach/Bowel: Normal stomach, without wall thickening. Colonic stool burden suggests constipation. Scattered colonic diverticula. Partial right  hemicolectomy. Normal small bowel. Vascular/Lymphatic: Aortic atherosclerosis. No abdominopelvic adenopathy. Reproductive: Normal uterus and adnexa. Prominent gonadal veins can be seen with pelvic congestion syndrome. Other: No significant free fluid. No free intraperitoneal air. No evidence of omental or peritoneal disease. Musculoskeletal: Moderate disc bulge at L4-5. Mild S shaped thoracolumbar spine curvature. IMPRESSION: 1. Partial right hemicolectomy, without metastatic disease in the chest, abdomen, or pelvis. 2. Ongoing stability of a 3 mm right middle lobe pulmonary nodule, which can be considered benign. 3.  Aortic Atherosclerosis (ICD10-I70.0). 4. Probable left nephrolithiasis. Electronically Signed   By: Jeronimo Greaves M.D.   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  MRI results from August 19, 2022 reviewed independently with no obvious evidence of metastatic disease.  Patient underwent partial hemicolectomy on August 21, 2022.  Given no high risk features, adjuvant chemotherapy was not necessary.  Her most recent CEA is 1.4.  CT scan results from June 03, 2023 reviewed independently and report as above with no obvious evidence of recurrent or progressive disease.  Patient will require repeat colonoscopy in the next 3 to 6 months.  Return to clinic in 6 months with repeat imaging and video-assisted telemedicine visit.   Pulmonary nodule: CT scan results as above.  Likely benign.  No further imaging necessary.    I provided 20 minutes of face-to-face video visit time during this encounter which included chart review, counseling, and coordination of care as documented above.   Patient expressed understanding and was  in agreement with this plan. She also understands that She can call clinic at any time with any questions, concerns, or complaints.    Cancer Staging  Colon cancer [facility]) Staging form: Colon and Rectum, AJCC 8th Edition - Clinical stage from 08/29/2022: Stage IIA (ycT3, cN0, cM0) - Signed by Jeralyn Ruths, MD on 08/29/2022 Stage prefix: Post-therapy Response to neoadjuvant therapy: Complete response Total positive nodes: 0 Total nodes examined: 26   Jeralyn Ruths, MD   06/11/2023 12:06 PM

## 2023-06-25 ENCOUNTER — Other Ambulatory Visit: Payer: Self-pay | Admitting: Internal Medicine

## 2023-06-25 DIAGNOSIS — Z1231 Encounter for screening mammogram for malignant neoplasm of breast: Secondary | ICD-10-CM

## 2023-08-11 ENCOUNTER — Ambulatory Visit: Payer: Medicare PPO

## 2023-08-11 DIAGNOSIS — K640 First degree hemorrhoids: Secondary | ICD-10-CM

## 2023-08-11 DIAGNOSIS — Z980 Intestinal bypass and anastomosis status: Secondary | ICD-10-CM

## 2023-08-11 DIAGNOSIS — K5730 Diverticulosis of large intestine without perforation or abscess without bleeding: Secondary | ICD-10-CM

## 2023-08-11 DIAGNOSIS — Z85038 Personal history of other malignant neoplasm of large intestine: Secondary | ICD-10-CM

## 2023-08-11 DIAGNOSIS — Z08 Encounter for follow-up examination after completed treatment for malignant neoplasm: Secondary | ICD-10-CM

## 2023-10-06 ENCOUNTER — Ambulatory Visit
Admission: RE | Admit: 2023-10-06 | Discharge: 2023-10-06 | Disposition: A | Discharge status: Home / Self Care | Payer: Medicare PPO | Source: Ambulatory Visit | Attending: Internal Medicine | Admitting: Internal Medicine

## 2023-10-06 DIAGNOSIS — Z1231 Encounter for screening mammogram for malignant neoplasm of breast: Secondary | ICD-10-CM | POA: Insufficient documentation

## 2023-11-08 ENCOUNTER — Other Ambulatory Visit
Admission: RE | Admit: 2023-11-08 | Discharge: 2023-11-08 | Disposition: A | Discharge status: Home / Self Care | Payer: Medicare PPO | Source: Ambulatory Visit | Attending: Family Medicine | Admitting: Family Medicine

## 2023-11-08 DIAGNOSIS — R10824 Left lower quadrant rebound abdominal tenderness: Secondary | ICD-10-CM | POA: Insufficient documentation

## 2023-11-08 LAB — BASIC METABOLIC PANEL
Anion gap: 7 (ref 5–15)
BUN: 19 mg/dL (ref 8–23)
CO2: 27 mmol/L (ref 22–32)
Calcium: 8.9 mg/dL (ref 8.9–10.3)
Chloride: 102 mmol/L (ref 98–111)
Creatinine, Ser: 0.73 mg/dL (ref 0.44–1.00)
GFR, Estimated: >60 mL/min (ref >60)
Glucose, Bld: 64 mg/dL — ABNORMAL LOW (ref 70–99)
Potassium: 3.7 mmol/L (ref 3.5–5.1)
Sodium: 136 mmol/L (ref 135–145)

## 2023-11-18 NOTE — Progress Notes (Unsigned)
No chief complaint on file.   HPI:      [name] is a 68 y.o. [phone] who LMP was No LMP recorded. Patient is postmenopausal., presents today for her MEDICARE annual examination.  Her menses are absent and she is postmenopausal. She does not have PMB. She does have occas vasomotor sx.  Sex activity: no longer sexually active - post menopausal status. She does not have vaginal sx  Last Pap: 09/04/21  Results were: no abnormalities /neg HPV DNA.  Hx of STDs: none  Last mammogram: 10/06/23 with PCP;  Results were: normal--routine follow-up in 12 months.  There is no FH of breast cancer. There is no FH of ovarian cancer. The patient does  self-breast exams.  Colonoscopy: colonoscopy 10/23 with [facility] with stage IIA adenocarcinoma of ascending colon with partial hemicolectomy; seeing oncology, no chemo needed. Doing well. Repeat colonoscopy due after 1 yr. Has CT scheduled **** DEXA: 09/25/21 at [facility] with osteopenia in spine/hip. Repeat due after 2 yrs. Pt getting calcium/Vit D.   Tobacco use: The patient denies current or previous tobacco use. Alcohol use: none  No drug use.  Exercise: moderately active  She does get adequate calcium and Vitamin D in her diet.  Labs with PCP.   Past Medical History:  Diagnosis Date   Abnormal uterine bleeding    Anemia    B12 deficiency    Fibrocystic disease of both breasts    History of mammogram 05/12/15; 06/03/16   BIRAD 2; NEG   History of Papanicolaou smear of cervix 05/12/2015; 05/14/2016   NEG; -/-   Lyme disease 2018   Malignant neoplasm of ascending colon (HCC) 07/2022   Osteoarthritis    Osteopenia    Solitary cyst of breast    NEG    Past Surgical History:  Procedure Laterality Date   BREAST CYST ASPIRATION Bilateral 07/29/97;02/28/00;111/4/00   RT; LT; LT; RT   CESAREAN SECTION  1986   COLONOSCOPY  04/10/2007   COLONOSCOPY  08/15/2017   COLONOSCOPY  08/05/2022   ENDOMETRIAL BIOPSY  11/26/2005   EARLY SECRETORY  ENDOMETRIUM C SLIGHTLY DISORDERED PATTERN    Family History  Problem Relation Age of Onset   Coronary artery disease Mother    Cerebrovascular Accident Mother    Hyperlipidemia Mother    Hypertension Mother    Chronic Renal Failure Mother    Hypercholesterolemia Mother    Transient ischemic attack Father 57   Breast cancer Neg Hx     Social History   Socioeconomic History   Marital status: Married    Spouse name: Arlys John   Number of children: 3   Years of education: 16   Highest education level: Not on file  Occupational History   Occupation: TEACHER  Tobacco Use   Smoking status: Never   Smokeless tobacco: Never  Vaping Use   Vaping status: Never Used  Substance and Sexual Activity   Alcohol use: No   Drug use: No   Sexual activity: Not Currently    Birth control/protection: Post-menopausal  Other Topics Concern   Not on file  Social History Narrative   Not on file   Social Drivers of Health   Financial Resource Strain: Not on file  Food Insecurity: No Food Insecurity (08/21/2022)   Hunger Vital Sign    Worried About Running Out of Food in the Last Year: Never true    Ran Out of Food in the Last Year: Never true  Transportation Needs: No Transportation  Needs (08/21/2022)   PRAPARE - Administrator, Civil Service (Medical): No    Lack of Transportation (Non-Medical): No  Physical Activity: Not on file  Stress: Not on file  Social Connections: Not on file  Intimate Partner Violence: Not At Risk (08/21/2022)   Humiliation, Afraid, Rape, and Kick questionnaire    Fear of Current or Ex-Partner: No    Emotionally Abused: No    Physically Abused: No    Sexually Abused: No     Current Outpatient Medications:    acyclovir cream (ZOVIRAX) 5 %, Apply topically., Disp: , Rfl:    Ascorbic Acid (VITAMIN C) 100 MG tablet, Take 100 mg by mouth daily., Disp: , Rfl:    aspirin 81 MG chewable tablet, Chew 81 mg by mouth daily., Disp: , Rfl:    Calcium Carbonate  (CALCIUM 600 PO), Take 1 tablet by mouth 2 (two) times daily., Disp: , Rfl:    Cholecalciferol (VITAMIN D3) 1000 units CAPS, Take 1 capsule by mouth daily., Disp: , Rfl:    Cyanocobalamin (VITAMIN B-12) 5000 MCG SUBL, Place 5,000 mcg under the tongue daily., Disp: , Rfl:    loratadine (CLARITIN) 10 MG tablet, Take 10 mg by mouth daily as needed for allergies., Disp: , Rfl:    metoprolol tartrate (LOPRESSOR) 100 MG tablet, Take 1 tablet (100 mg total) by mouth once for 1 dose. Please take one time dose 100mg  metoprolol tartrate 2 hr prior to cardiac CT for HR control IF HR >55bpm., Disp: 1 tablet, Rfl: 0   Multiple Vitamins-Minerals (EMERGEN-C VITAMIN C PO), Take 1 tablet by mouth daily as needed., Disp: , Rfl:    Pediatric Multivit-Minerals (FLINTSTONES GUMMIES PO), Take 2 tablets by mouth daily., Disp: , Rfl:    ROS:  Review of Systems  Constitutional:  Negative for fatigue, fever and unexpected weight change.  Respiratory:  Negative for cough, shortness of breath and wheezing.   Cardiovascular:  Negative for chest pain, palpitations and leg swelling.  Gastrointestinal:  Negative for blood in stool, constipation, diarrhea, nausea and vomiting.  Endocrine: Negative for cold intolerance, heat intolerance and polyuria.  Genitourinary:  Negative for dyspareunia, dysuria, flank pain, frequency, genital sores, hematuria, menstrual problem, pelvic pain, urgency, vaginal bleeding, vaginal discharge and vaginal pain.  Musculoskeletal:  Negative for back pain, joint swelling and myalgias.  Skin:  Negative for rash.  Neurological:  Negative for dizziness, syncope, light-headedness, numbness and headaches.  Hematological:  Negative for adenopathy.  Psychiatric/Behavioral:  Negative for agitation, confusion, sleep disturbance and suicidal ideas. The patient is not nervous/anxious.      Objective: There were no vitals taken for this visit.   Physical Exam Constitutional:      Appearance: She is  well-developed.  Genitourinary:     Vulva normal.     Right Labia: No rash, tenderness or lesions.    Left Labia: No tenderness, lesions or rash.    No vaginal discharge, erythema or tenderness.      Right Adnexa: not tender and no mass present.    Left Adnexa: not tender and no mass present.    No cervical motion tenderness, friability or polyp.     Uterus is not enlarged or tender.  Breasts:    Right: No mass, nipple discharge, skin change or tenderness.     Left: No mass, nipple discharge, skin change or tenderness.  Neck:     Thyroid: No thyromegaly.  Cardiovascular:     Rate and Rhythm: Normal rate and  regular rhythm.     Heart sounds: Normal heart sounds. No murmur heard. Pulmonary:     Effort: Pulmonary effort is normal.     Breath sounds: Normal breath sounds.  Abdominal:     Palpations: Abdomen is soft.     Tenderness: There is no abdominal tenderness. There is no guarding or rebound.  Musculoskeletal:        General: Normal range of motion.     Cervical back: Normal range of motion.  Lymphadenopathy:     Cervical: No cervical adenopathy.  Neurological:     General: No focal deficit present.     Mental Status: She is alert and oriented to person, place, and time.     Cranial Nerves: No cranial nerve deficit.  Skin:    General: Skin is warm and dry.  Psychiatric:        Mood and Affect: Mood normal.        Behavior: Behavior normal.        Thought Content: Thought content normal.        Judgment: Judgment normal.  Vitals reviewed.     Assessment/Plan:  Encounter for annual routine gynecological examination  Encounter for screening mammogram for malignant neoplasm of breast; pt current on mammo  Malignant neoplasm of ascending colon (HCC)--followed by GI and oncology           GYN counsel mammography screening, adequate intake of calcium and vitamin D, diet and exercise    F/U  No follow-ups on file.  Licia Harl B. Tommie Dejoseph, PA-C 11/18/2023 4:54 PM

## 2023-11-20 ENCOUNTER — Encounter: Payer: Self-pay | Admitting: Obstetrics and Gynecology

## 2023-11-20 ENCOUNTER — Ambulatory Visit: Payer: Medicare PPO | Admitting: Obstetrics and Gynecology

## 2023-11-20 VITALS — BP 119/62 | HR 71 | Ht 66.0 in | Wt 148.0 lb

## 2023-11-20 DIAGNOSIS — Z01419 Encounter for gynecological examination (general) (routine) without abnormal findings: Secondary | ICD-10-CM

## 2023-11-20 DIAGNOSIS — Z780 Asymptomatic menopausal state: Secondary | ICD-10-CM

## 2023-11-20 DIAGNOSIS — Z1231 Encounter for screening mammogram for malignant neoplasm of breast: Secondary | ICD-10-CM

## 2023-11-20 DIAGNOSIS — C182 Malignant neoplasm of ascending colon: Secondary | ICD-10-CM

## 2023-11-20 NOTE — Patient Instructions (Addendum)
I value your feedback and you entrusting [place] with your care. If you get a [facility] patient survey, I would appreciate you taking the time to let us know about your experience today. Thank you!  [facility] ([place])--[phone]

## 2023-11-27 ENCOUNTER — Ambulatory Visit
Admission: RE | Admit: 2023-11-27 | Discharge: 2023-11-27 | Disposition: A | Discharge status: Home / Self Care | Payer: Medicare PPO | Source: Ambulatory Visit | Attending: Obstetrics and Gynecology | Admitting: Obstetrics and Gynecology

## 2023-11-27 DIAGNOSIS — Z780 Asymptomatic menopausal state: Secondary | ICD-10-CM | POA: Diagnosis present

## 2023-11-27 DIAGNOSIS — M8580 Other specified disorders of bone density and structure, unspecified site: Secondary | ICD-10-CM | POA: Insufficient documentation

## 2023-12-02 ENCOUNTER — Other Ambulatory Visit: Payer: Medicare PPO

## 2023-12-02 DIAGNOSIS — Z780 Asymptomatic menopausal state: Secondary | ICD-10-CM

## 2023-12-03 LAB — VITAMIN D 25 HYDROXY (VIT D DEFICIENCY, FRACTURES): Vit D, 25-Hydroxy: 64.7 ng/mL (ref 30.0–100.0)

## 2023-12-04 ENCOUNTER — Encounter: Payer: Self-pay | Admitting: Obstetrics and Gynecology

## 2023-12-11 ENCOUNTER — Other Ambulatory Visit: Payer: Self-pay | Admitting: *Deleted

## 2023-12-11 DIAGNOSIS — C182 Malignant neoplasm of ascending colon: Secondary | ICD-10-CM

## 2023-12-12 ENCOUNTER — Inpatient Hospital Stay: Payer: Medicare PPO | Attending: Oncology

## 2023-12-12 ENCOUNTER — Ambulatory Visit
Admission: RE | Admit: 2023-12-12 | Discharge: 2023-12-12 | Disposition: A | Discharge status: Home / Self Care | Payer: Medicare PPO | Source: Ambulatory Visit | Attending: Oncology | Admitting: Oncology

## 2023-12-12 DIAGNOSIS — Z08 Encounter for follow-up examination after completed treatment for malignant neoplasm: Secondary | ICD-10-CM | POA: Insufficient documentation

## 2023-12-12 DIAGNOSIS — C182 Malignant neoplasm of ascending colon: Secondary | ICD-10-CM

## 2023-12-12 DIAGNOSIS — Z85038 Personal history of other malignant neoplasm of large intestine: Secondary | ICD-10-CM | POA: Diagnosis present

## 2023-12-12 LAB — CBC WITH DIFFERENTIAL/PLATELET
Abs Immature Granulocytes: 0.02 10*3/uL (ref 0.00–0.07)
Basophils Absolute: 0.1 10*3/uL (ref 0.0–0.1)
Basophils Relative: 1 %
Eosinophils Absolute: 0.6 10*3/uL — ABNORMAL HIGH (ref 0.0–0.5)
Eosinophils Relative: 9 %
HCT: 41.3 % (ref 36.0–46.0)
Hemoglobin: 13.6 g/dL (ref 12.0–15.0)
Immature Granulocytes: 0 %
Lymphocytes Relative: 26 %
Lymphs Abs: 1.6 10*3/uL (ref 0.7–4.0)
MCH: 29.5 pg (ref 26.0–34.0)
MCHC: 32.9 g/dL (ref 30.0–36.0)
MCV: 89.6 fL (ref 80.0–100.0)
Monocytes Absolute: 0.5 10*3/uL (ref 0.1–1.0)
Monocytes Relative: 9 %
Neutro Abs: 3.5 10*3/uL (ref 1.7–7.7)
Neutrophils Relative %: 55 %
Platelets: 207 10*3/uL (ref 150–400)
RBC: 4.61 MIL/uL (ref 3.87–5.11)
RDW: 12.6 % (ref 11.5–15.5)
WBC: 6.3 10*3/uL (ref 4.0–10.5)
nRBC: 0 % (ref 0.0–0.2)

## 2023-12-12 MED ORDER — BARIUM SULFATE 2 % PO SUSP
450.0000 mL | ORAL | Status: AC
  Administered 2023-12-12 (×2): 450 mL via ORAL

## 2023-12-12 MED ORDER — IOHEXOL 300 MG/ML  SOLN
85.0000 mL | Freq: Once | INTRAMUSCULAR | Status: AC | PRN
  Administered 2023-12-12: 85 mL via INTRAVENOUS

## 2023-12-13 LAB — CEA: CEA: 1.3 ng/mL (ref 0.0–4.7)

## 2023-12-19 ENCOUNTER — Inpatient Hospital Stay: Payer: Medicare PPO | Admitting: Oncology

## 2023-12-19 ENCOUNTER — Encounter: Payer: Self-pay | Admitting: *Deleted

## 2023-12-23 ENCOUNTER — Inpatient Hospital Stay: Attending: Oncology | Admitting: Oncology

## 2023-12-23 DIAGNOSIS — C182 Malignant neoplasm of ascending colon: Secondary | ICD-10-CM | POA: Diagnosis not present

## 2023-12-23 NOTE — Progress Notes (Signed)
 [facility]  [phone] Fax:([phone]) [phone]  ID: Brianna Dickerson OB: July 17, 1956  MR#: [id number]  YQI#:[id number]  Patient Care Team: Danella Penton, MD as PCP - General (Internal Medicine) Benita Gutter, RN as Oncology Nurse Navigator  I connected with Christiana Pellant on 12/23/23 at 11:00 AM EST by video enabled telemedicine visit and verified that I am speaking with the correct person using two identifiers.   I discussed the limitations, risks, security and privacy concerns of performing an evaluation and management service by telemedicine and the availability of in-person appointments. I also discussed with the patient that there may be a patient responsible charge related to this service. The patient expressed understanding and agreed to proceed.   Other persons participating in the visit and their role in the encounter: Patient, MD.  Patient's location: Home. Provider's location: Clinic.  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  INTERVAL HISTORY: Patient agreed to video-assisted telemedicine visit for further evaluation and discussion of her imaging results.  She continues to feel well and remains asymptomatic.  She has no neurologic complaints.  She denies any recent fevers or illnesses.  She has a good appetite and denies weight loss.  She has no chest pain, shortness of breath, cough, or hemoptysis.  She denies any nausea, vomiting, constipation, or diarrhea.  She has no melena or hematochezia.  She has no urinary complaints.  Patient offers no specific complaints today.  REVIEW OF SYSTEMS:   Review of Systems  Constitutional: Negative.  Negative for fever, malaise/fatigue and weight loss.  Respiratory: Negative.  Negative for cough, hemoptysis and shortness of breath.   Cardiovascular: Negative.  Negative for chest pain and leg swelling.  Gastrointestinal: Negative.  Negative  for abdominal pain, blood in stool and melena.  Genitourinary: Negative.  Negative for dysuria.  Musculoskeletal: Negative.  Negative for back pain.  Skin: Negative.  Negative for rash.  Neurological: Negative.  Negative for dizziness, focal weakness, weakness and headaches.  Psychiatric/Behavioral: Negative.  The patient is not nervous/anxious.     As per HPI. Otherwise, a complete review of systems is negative.  PAST MEDICAL HISTORY: Past Medical History:  Diagnosis Date   Abnormal uterine bleeding    Anemia    B12 deficiency    Fibrocystic disease of both breasts    History of mammogram 05/12/15; 06/03/16   BIRAD 2; NEG   History of Papanicolaou smear of cervix 05/12/2015; 05/14/2016   NEG; -/-   Lyme disease 2018   Malignant neoplasm of ascending colon (HCC) 07/2022   Osteoarthritis    Osteopenia    Solitary cyst of breast    NEG    PAST SURGICAL HISTORY: Past Surgical History:  Procedure Laterality Date   BREAST CYST ASPIRATION Bilateral 07/29/97;02/28/00;111/4/00   RT; LT; LT; RT   CESAREAN SECTION  1986   COLONOSCOPY  04/10/2007   COLONOSCOPY  08/15/2017   COLONOSCOPY  08/05/2022   ENDOMETRIAL BIOPSY  11/26/2005   EARLY SECRETORY ENDOMETRIUM C SLIGHTLY DISORDERED PATTERN    FAMILY HISTORY: Family History  Problem Relation Age of Onset   Coronary artery disease Mother    Cerebrovascular Accident Mother    Hyperlipidemia Mother    Hypertension Mother    Chronic Renal Failure Mother    Hypercholesterolemia Mother    Transient ischemic attack Father 33   Breast cancer Neg Hx     ADVANCED DIRECTIVES (Y/N):  N  HEALTH MAINTENANCE: Social History  Tobacco Use   Smoking status: Never   Smokeless tobacco: Never  Vaping Use   Vaping status: Never Used  Substance Use Topics   Alcohol use: No   Drug use: No     Colonoscopy:  PAP:  Bone density:  Lipid panel:  Allergies  Allergen Reactions   Shellfish Allergy Nausea And Vomiting and Other (See  Comments)    Shrimp    Current Outpatient Medications  Medication Sig Dispense Refill   acyclovir ointment (ZOVIRAX) 5 % Apply 1 Application topically as needed.     aspirin EC 325 MG tablet Take by mouth.     Calcium Carbonate (CALCIUM 600 PO) Take 1 tablet by mouth 2 (two) times daily.     Cholecalciferol (VITAMIN D3) 1000 units CAPS Take 1 capsule by mouth daily.     Cyanocobalamin (VITAMIN B-12) 5000 MCG SUBL Place 5,000 mcg under the tongue daily.     loratadine (CLARITIN) 10 MG tablet Take 10 mg by mouth daily as needed for allergies.     nitroGLYCERIN (NITROSTAT) 0.4 MG SL tablet Place under the tongue.     rosuvastatin (CRESTOR) 10 MG tablet Take 10 mg by mouth at bedtime.     No current facility-administered medications for this visit.    OBJECTIVE: There were no vitals filed for this visit.    There is no height or weight on file to calculate BMI.    ECOG FS:0 - Asymptomatic  General: Well-developed, well-nourished, no acute distress. HEENT: Normocephalic. Neuro: Alert, answering all questions appropriately. Cranial nerves grossly intact. Psych: Normal affect.  LAB RESULTS:  Lab Results  Component Value Date   NA 136 11/08/2023   K 3.7 11/08/2023   CL 102 11/08/2023   CO2 27 11/08/2023   GLUCOSE 64 (L) 11/08/2023   BUN 19 11/08/2023   CREATININE 0.73 11/08/2023   CALCIUM 8.9 11/08/2023   GFRNONAA >60 11/08/2023    Lab Results  Component Value Date   WBC 6.3 12/12/2023   NEUTROABS 3.5 12/12/2023   HGB 13.6 12/12/2023   HCT 41.3 12/12/2023   MCV 89.6 12/12/2023   PLT 207 12/12/2023     STUDIES: CT ABDOMEN PELVIS W CONTRAST Result Date: 12/20/2023 CLINICAL DATA:  Malignant neoplasm of ascending colon, follow-up. * Tracking Code: BO * EXAM: CT ABDOMEN AND PELVIS WITH CONTRAST TECHNIQUE: Multidetector CT imaging of the abdomen and pelvis was performed using the standard protocol following bolus administration of intravenous contrast. RADIATION DOSE  REDUCTION: This exam was performed according to the departmental dose-optimization program which includes automated exposure control, adjustment of the mA and/or kV according to patient size and/or use of iterative reconstruction technique. CONTRAST:  85mL OMNIPAQUE IOHEXOL 300 MG/ML  SOLN COMPARISON:  Multiple priors including CT June 03, 2023 FINDINGS: Lower chest: No acute abnormality. Hepatobiliary: No suspicious hepatic lesion. Gallbladder is unremarkable. No biliary ductal dilation. Pancreas: No pancreatic ductal dilation or evidence of acute inflammation. Spleen: No splenomegaly. Adrenals/Urinary Tract: Bilateral adrenal glands appear normal. No hydronephrosis. Kidneys demonstrate symmetric enhancement. Punctate nonobstructive bilateral renal stones. Right interpolar renal cyst is considered benign and requiring no independent imaging follow-up. Urinary bladder is unremarkable for degree of distension. Stomach/Bowel: Radiopaque enteric contrast material traverses the sigmoid colon. Stomach is unremarkable for degree of distension. No pathologic dilation of small or large bowel. Colonic stool burden compatible with constipation. Colonic diverticulosis. Prior partial right hemicolectomy without evidence local recurrence. Vascular/Lymphatic: Aortic atherosclerosis. Smooth IVC contours. The portal, splenic and superior mesenteric veins are patent. Dilation  and early filling of the gonadal veins and pelvic collateral vessels. No pathologically enlarged abdominal or pelvic lymph nodes. Reproductive: Uterus and bilateral adnexa are unremarkable. Other: No significant abdominopelvic free fluid. No discrete peritoneal or omental nodularity. Postsurgical change in the abdominal wall. Musculoskeletal: No aggressive lytic or blastic lesion of bone. Multilevel degenerative change of the spine. IMPRESSION: 1. Prior partial right hemicolectomy without evidence of local recurrence or metastatic disease in the abdomen or  pelvis. 2. Dilation and early filling of the gonadal veins and pelvic collateral vessels, which can be seen in the setting of pelvic congestion syndrome. 3. Punctate nonobstructive bilateral renal stones. 4. Colonic diverticulosis without evidence of acute diverticulitis. 5.  Aortic Atherosclerosis (ICD10-I70.0). Electronically Signed   By: Maudry Mayhew M.D.   On: 12/20/2023 09:27   DG Bone Density Result Date: 11/27/2023 EXAM: DUAL X-RAY ABSORPTIOMETRY (DXA) FOR BONE MINERAL DENSITY IMPRESSION: Your patient Morris Markham completed a BMD test on 11/27/2023 using the [employer] iDXA DXA System (software version: 14.10) manufactured by [employer]. The following summarizes the results of our evaluation. Technologist:VLM PATIENT BIOGRAPHICAL: Name: Danette, Weinfeld Patient ID: [id number] Birth Date: 03-04-1956 Height: 66.0 in. Gender: Female Exam Date: 11/27/2023 Weight: 148.0 lbs. Indications: Family Hist. (Parent hip fracture), Osteoarthritis, Caucasian, Parent Hip Fracture, Postmenopausal Fractures: Treatments: calcium w/ vit D DENSITOMETRY RESULTS: Site         Region        Measured Date Measured Age WHO Classification Young Adult T-score BMD         %Change vs. Previous Significant Change (*) DualFemur Total Left 11/27/2023 67.6 Osteopenia -2.2 0.731 g/cm2 -3.9% Yes DualFemur Total Left 09/25/2021 65.4 Osteopenia -2.0 0.761 g/cm2 - - DualFemur Total Mean 11/27/2023 67.6 Osteopenia -2.0 0.757 g/cm2 -1.3% - DualFemur Total Mean 09/25/2021 65.4 Osteopenia -1.9 0.767 g/cm2 - - Left Forearm Radius 33% 11/27/2023 67.6 Osteopenia -2.3 0.674 g/cm2 - - ASSESSMENT: The BMD measured at AP Spine L1-L4 (L2,L3) is 0.901 g/cm2 with a T-score of -2.3. This patient is considered osteopenic according to World Health Organization [facility]) criteria. The lumbar spine was excluded due to degenerative changes. Compared with prior study, there has been no significant change in the spine. Compared with prior study, there has  been no significant change in the total hip. The scan quality is good. World Science writer [facility]) criteria for post-menopausal, Caucasian Women: Normal:                   T-score at or above -1 SD Osteopenia/low bone mass: T-score between -1 and -2.5 SD Osteoporosis:             T-score at or below -2.5 SD RECOMMENDATIONS: 1. All patients should optimize calcium and vitamin D intake. 2. Consider FDA-approved medical therapies in postmenopausal women and men aged 46 years and older, based on the following: a. A hip or vertebral(clinical or morphometric) fracture b. T-score < -2.5 at the femoral neck or spine after appropriate evaluation to exclude secondary causes c. Low bone mass (T-score between -1.0 and -2.5 at the femoral neck or spine) and a 10-year probability of a hip fracture > 3% or a 10-year probability of a major osteoporosis-related fracture > 20% based on the [place]-adapted WHO algorithm 3. Clinician judgment and/or patient preferences may indicate treatment for people with 10-year fracture probabilities above or below these levels FOLLOW-UP: People with diagnosed cases of osteoporosis or at high risk for fracture should have regular bone mineral density tests. For patients  eligible for Medicare, routine testing is allowed once every 2 years. The testing frequency can be increased to one year for patients who have rapidly progressing disease, those who are receiving or discontinuing medical therapy to restore bone mass, or have additional risk factors. I have reviewed this report, and agree with the above findings. [facility] Dear [place] Records, Your patient BERYL HORNBERGER completed a FRAX assessment on 11/27/2023 using the [facility] iDXA DXA System (analysis version: 14.10) manufactured by [employer]. The following summarizes the results of our evaluation. PATIENT BIOGRAPHICAL: Name: Charmon, Thorson Patient ID: [id number] Birth Date: 1956-04-04 Height:    66.0 in. Gender:      [age]    [age]       Weight:    148.0 lbs. Ethnicity:  White                            Exam Date: 11/27/2023 FRAX* RESULTS:  (version: 3.5) 10-year Probability of Fracture1 Major Osteoporotic Fracture2 Hip Fracture 19.3% 3.1% Population: [place] (Caucasian) Risk Factors: Family Hist. (Parent hip fracture) Based on Femur (Right) Neck BMD 1 -The 10-year probability of fracture may be lower than reported if the patient has received treatment. 2 -Major Osteoporotic Fracture: Clinical Spine, Forearm, Hip or Shoulder *FRAX is a Armed forces logistics/support/administrative officer of the [employer] for Metabolic Bone Disease, a World Science writer (WHO) [employer]. ASSESSMENT: The probability of a major osteoporotic fracture is 19.3% within the next ten years. The probability of a hip fracture is 3.1% within the next ten years. . Electronically Signed   By: Romona Curls M.D.   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  MRI results from August 19, 2022 reviewed independently with no obvious evidence of metastatic disease.  Patient underwent partial hemicolectomy on August 21, 2022.  Given no high risk features, adjuvant chemotherapy was not necessary.  CEA continues to be within normal limits.  Colonoscopy in October 2024 did not reveal any significant pathology.  CT scan on December 12, 2023 reviewed independently and reported as above with no obvious evidence of recurrent or progressive disease.  No intervention is needed.  Return to clinic in 6 months with repeat laboratory work, imaging, and video-assisted telemedicine visit.    History of pulmonary nodule:Likely benign.  No further imaging necessary.    I spent a  total of 20 minutes reviewing chart data, face-to-face evaluation with the patient, counseling and coordination of care as detailed above.    Patient expressed understanding and was in agreement with this plan. She also understands that She can call clinic at any time with any questions, concerns, or complaints.    Cancer Staging  Colon cancer [facility]) Staging form: Colon and Rectum, AJCC 8th Edition - Clinical stage from 08/29/2022: Stage IIA (ycT3, cN0, cM0) - Signed by Jeralyn Ruths, MD on 08/29/2022 Stage prefix: Post-therapy Response to neoadjuvant therapy: Complete response Total positive nodes: 0 Total nodes examined: 26   Jeralyn Ruths, MD   12/23/2023 1:21 PM

## 2023-12-24 ENCOUNTER — Inpatient Hospital Stay: Admitting: Oncology

## 2024-06-22 ENCOUNTER — Other Ambulatory Visit: Payer: Self-pay | Admitting: *Deleted

## 2024-06-22 DIAGNOSIS — C182 Malignant neoplasm of ascending colon: Secondary | ICD-10-CM

## 2024-06-23 ENCOUNTER — Ambulatory Visit
Admission: RE | Admit: 2024-06-23 | Discharge: 2024-06-23 | Disposition: A | Discharge status: Home / Self Care | Source: Ambulatory Visit | Attending: Oncology | Admitting: Oncology

## 2024-06-23 ENCOUNTER — Inpatient Hospital Stay: Attending: Oncology

## 2024-06-23 ENCOUNTER — Other Ambulatory Visit

## 2024-06-23 DIAGNOSIS — C182 Malignant neoplasm of ascending colon: Secondary | ICD-10-CM | POA: Insufficient documentation

## 2024-06-23 DIAGNOSIS — Z08 Encounter for follow-up examination after completed treatment for malignant neoplasm: Secondary | ICD-10-CM | POA: Diagnosis present

## 2024-06-23 DIAGNOSIS — Z85038 Personal history of other malignant neoplasm of large intestine: Secondary | ICD-10-CM | POA: Insufficient documentation

## 2024-06-23 LAB — CBC WITH DIFFERENTIAL/PLATELET
Abs Immature Granulocytes: 0.03 K/uL (ref 0.00–0.07)
Basophils Absolute: 0.1 K/uL (ref 0.0–0.1)
Basophils Relative: 1 %
Eosinophils Absolute: 0.2 K/uL (ref 0.0–0.5)
Eosinophils Relative: 3 %
HCT: 42.6 % (ref 36.0–46.0)
Hemoglobin: 13.9 g/dL (ref 12.0–15.0)
Immature Granulocytes: 0 %
Lymphocytes Relative: 20 %
Lymphs Abs: 1.6 K/uL (ref 0.7–4.0)
MCH: 29.3 pg (ref 26.0–34.0)
MCHC: 32.6 g/dL (ref 30.0–36.0)
MCV: 89.7 fL (ref 80.0–100.0)
Monocytes Absolute: 0.6 K/uL (ref 0.1–1.0)
Monocytes Relative: 8 %
Neutro Abs: 5.6 K/uL (ref 1.7–7.7)
Neutrophils Relative %: 68 %
Platelets: 219 K/uL (ref 150–400)
RBC: 4.75 MIL/uL (ref 3.87–5.11)
RDW: 12.3 % (ref 11.5–15.5)
WBC: 8.1 K/uL (ref 4.0–10.5)
nRBC: 0 % (ref 0.0–0.2)

## 2024-06-23 LAB — CMP (CANCER CENTER ONLY)
ALT: 22 U/L (ref 0–44)
AST: 21 U/L (ref 15–41)
Albumin: 4.2 g/dL (ref 3.5–5.0)
Alkaline Phosphatase: 50 U/L (ref 38–126)
Anion gap: 9 (ref 5–15)
BUN: 25 mg/dL — ABNORMAL HIGH (ref 8–23)
CO2: 25 mmol/L (ref 22–32)
Calcium: 9.3 mg/dL (ref 8.9–10.3)
Chloride: 102 mmol/L (ref 98–111)
Creatinine: 0.76 mg/dL (ref 0.44–1.00)
GFR, Estimated: >60 mL/min (ref >60)
Glucose, Bld: 92 mg/dL (ref 70–99)
Potassium: 4.3 mmol/L (ref 3.5–5.1)
Sodium: 136 mmol/L (ref 135–145)
Total Bilirubin: 0.9 mg/dL (ref 0.0–1.2)
Total Protein: 7.4 g/dL (ref 6.5–8.1)

## 2024-06-23 MED ORDER — BARIUM SULFATE 2 % PO SUSP
450.0000 mL | ORAL | Status: AC
  Administered 2024-06-23 (×2): 450 mL via ORAL

## 2024-06-23 MED ORDER — IOHEXOL 300 MG/ML  SOLN
80.0000 mL | Freq: Once | INTRAMUSCULAR | Status: AC | PRN
  Administered 2024-06-23: 80 mL via INTRAVENOUS

## 2024-06-24 LAB — CEA: CEA: 1.5 ng/mL (ref 0.0–4.7)

## 2024-07-01 ENCOUNTER — Inpatient Hospital Stay: Admitting: Oncology

## 2024-07-01 ENCOUNTER — Encounter: Payer: Self-pay | Admitting: Oncology

## 2024-07-01 DIAGNOSIS — C182 Malignant neoplasm of ascending colon: Secondary | ICD-10-CM

## 2024-07-01 NOTE — Progress Notes (Signed)
 [facility]  Telephone:(336) [phone] [phone]) [phone]  ID: Brianna Dickerson OB: October 01, 1956  MR#: [id number]  RDW#:[id number]  Patient Care Team: Cleotilde Oneil FALCON, MD as PCP - General (Internal Medicine) Maurie Rayfield BIRCH, RN as Oncology Nurse Navigator  I connected with Brianna Dickerson on 07/01/24 at  3:30 PM EDT by video enabled telemedicine visit and verified that I am speaking with the correct person using two identifiers.   I discussed the limitations, risks, security and privacy concerns of performing an evaluation and management service by telemedicine and the availability of in-person appointments. I also discussed with the patient that there may be a patient responsible charge related to this service. The patient expressed understanding and agreed to proceed.   Other persons participating in the visit and their role in the encounter: Patient, MD.  Patient's location: Home. Provider's location: Clinic.  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  INTERVAL HISTORY: Patient agreed to video-assisted telemedicine visit for routine 77-month evaluation and discussion of her laboratory and imaging results.  She continues to feel well and remains asymptomatic. She has no neurologic complaints.  She denies any recent fevers or illnesses.  She has a good appetite and denies weight loss.  She has no chest pain, shortness of breath, cough, or hemoptysis.  She denies any nausea, vomiting, constipation, or diarrhea.  She has no melena or hematochezia.  She has no urinary complaints.  Patient offers no specific complaints today.  REVIEW OF SYSTEMS:   Review of Systems  Constitutional: Negative.  Negative for fever, malaise/fatigue and weight loss.  Respiratory: Negative.  Negative for cough, hemoptysis and shortness of breath.   Cardiovascular: Negative.  Negative for chest pain and leg swelling.  Gastrointestinal:  Negative.  Negative for abdominal pain, blood in stool and melena.  Genitourinary: Negative.  Negative for dysuria.  Musculoskeletal: Negative.  Negative for back pain.  Skin: Negative.  Negative for rash.  Neurological: Negative.  Negative for dizziness, focal weakness, weakness and headaches.  Psychiatric/Behavioral: Negative.  The patient is not nervous/anxious.     As per HPI. Otherwise, a complete review of systems is negative.  PAST MEDICAL HISTORY: Past Medical History:  Diagnosis Date   Abnormal uterine bleeding    Anemia    B12 deficiency    Fibrocystic disease of both breasts    History of mammogram 05/12/15; 06/03/16   BIRAD 2; NEG   History of Papanicolaou smear of cervix 05/12/2015; 05/14/2016   NEG; -/-   Lyme disease 2018   Malignant neoplasm of ascending colon (HCC) 07/2022   Osteoarthritis    Osteopenia    Solitary cyst of breast    NEG    PAST SURGICAL HISTORY: Past Surgical History:  Procedure Laterality Date   BREAST CYST ASPIRATION Bilateral 07/29/97;02/28/00;111/4/00   RT; LT; LT; RT   CESAREAN SECTION  1986   COLONOSCOPY  04/10/2007   COLONOSCOPY  08/15/2017   COLONOSCOPY  08/05/2022   ENDOMETRIAL BIOPSY  11/26/2005   EARLY SECRETORY ENDOMETRIUM C SLIGHTLY DISORDERED PATTERN    FAMILY HISTORY: Family History  Problem Relation Age of Onset   Coronary artery disease Mother    Cerebrovascular Accident Mother    Hyperlipidemia Mother    Hypertension Mother    Chronic Renal Failure Mother    Hypercholesterolemia Mother    Transient ischemic attack Father 48   Breast cancer Neg Hx     ADVANCED DIRECTIVES (Y/N):  N  HEALTH  MAINTENANCE: Social History   Tobacco Use   Smoking status: Never   Smokeless tobacco: Never  Vaping Use   Vaping status: Never Used  Substance Use Topics   Alcohol use: No   Drug use: No     Colonoscopy:  PAP:  Bone density:  Lipid panel:  Allergies  Allergen Reactions   Shellfish Allergy Nausea And Vomiting  and Other (See Comments)    Shrimp    Current Outpatient Medications  Medication Sig Dispense Refill   acyclovir ointment (ZOVIRAX) 5 % Apply 1 Application topically as needed.     aspirin EC 325 MG tablet Take by mouth.     Calcium Carbonate (CALCIUM 600 PO) Take 1 tablet by mouth 2 (two) times daily.     Cholecalciferol (VITAMIN D3) 1000 units CAPS Take 1 capsule by mouth daily.     Cyanocobalamin (VITAMIN B-12) 5000 MCG SUBL Place 5,000 mcg under the tongue daily.     loratadine (CLARITIN) 10 MG tablet Take 10 mg by mouth daily as needed for allergies.     rosuvastatin (CRESTOR) 10 MG tablet Take 10 mg by mouth at bedtime.     No current facility-administered medications for this visit.    OBJECTIVE: There were no vitals filed for this visit.    There is no height or weight on file to calculate BMI.    ECOG FS:0 - Asymptomatic  General: Well-developed, well-nourished, no acute distress. HEENT: Normocephalic. Neuro: Alert, answering all questions appropriately. Cranial nerves grossly intact. Psych: Normal affect.  LAB RESULTS:  Lab Results  Component Value Date   NA 136 06/23/2024   K 4.3 06/23/2024   CL 102 06/23/2024   CO2 25 06/23/2024   GLUCOSE 92 06/23/2024   BUN 25 (H) 06/23/2024   CREATININE 0.76 06/23/2024   CALCIUM 9.3 06/23/2024   PROT 7.4 06/23/2024   ALBUMIN 4.2 06/23/2024   AST 21 06/23/2024   ALT 22 06/23/2024   ALKPHOS 50 06/23/2024   BILITOT 0.9 06/23/2024   GFRNONAA >60 06/23/2024    Lab Results  Component Value Date   WBC 8.1 06/23/2024   NEUTROABS 5.6 06/23/2024   HGB 13.9 06/23/2024   HCT 42.6 06/23/2024   MCV 89.7 06/23/2024   PLT 219 06/23/2024      STUDIES: No results found.   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  MRI results from August 19, 2022 reviewed independently with no obvious evidence of metastatic disease.  Patient underwent partial hemicolectomy on August 21, 2022.  Given no high risk features, adjuvant chemotherapy was not necessary.  CEA continues to be within normal limits.  Colonoscopy in October 2024 did not reveal any significant pathology.  Repeat in October 2025.  CT scan on December 12, 2023 ordered no obvious evidence of recurrent or progressive disease.  Repeat CT scan on June 23, 2024 is pending at time of dictation.  No intervention is needed.  Return to clinic in 6 months with repeat laboratory work, imaging, and video-assisted telemedicine visit.  Will continue CT scans every 6 months for a total of 3 years then transition to yearly imaging.    History of pulmonary nodule: Likely benign.  No further imaging necessary.    I provided 20 minutes of face-to-face video visit time  during this encounter which included chart review, counseling, and coordination of care as documented above.     Patient expressed understanding and was in agreement with this plan. She also understands that She can call clinic at any time with any questions, concerns, or complaints.    Cancer Staging  Colon cancer [facility]) Staging form: Colon and Rectum, AJCC 8th Edition - Clinical stage from 08/29/2022: Stage IIA (ycT3, ycN0, cM0) - Signed by Jacobo Evalene PARAS, MD on 08/29/2022 Stage prefix: Post-therapy Response to neoadjuvant therapy: Complete response Total positive nodes: 0 Total nodes examined: 26   Evalene PARAS Jacobo, MD   07/01/2024 3:11 PM

## 2024-07-01 NOTE — Progress Notes (Signed)
 Patient is doing well, no new questions for the doctor today.

## 2024-07-06 ENCOUNTER — Encounter: Payer: Self-pay | Admitting: Oncology

## 2024-07-13 ENCOUNTER — Other Ambulatory Visit: Payer: Self-pay | Admitting: Orthopedic Surgery

## 2024-07-13 DIAGNOSIS — M25561 Pain in right knee: Secondary | ICD-10-CM

## 2024-07-22 ENCOUNTER — Ambulatory Visit
Admission: RE | Admit: 2024-07-22 | Discharge: 2024-07-22 | Disposition: A | Discharge status: Home / Self Care | Source: Ambulatory Visit | Attending: Orthopedic Surgery | Admitting: Orthopedic Surgery

## 2024-07-22 DIAGNOSIS — M25561 Pain in right knee: Secondary | ICD-10-CM | POA: Insufficient documentation

## 2024-08-16 ENCOUNTER — Ambulatory Visit

## 2024-08-16 DIAGNOSIS — K5730 Diverticulosis of large intestine without perforation or abscess without bleeding: Secondary | ICD-10-CM | POA: Diagnosis not present

## 2024-08-16 DIAGNOSIS — Z08 Encounter for follow-up examination after completed treatment for malignant neoplasm: Secondary | ICD-10-CM | POA: Diagnosis not present

## 2024-08-16 DIAGNOSIS — Z85038 Personal history of other malignant neoplasm of large intestine: Secondary | ICD-10-CM | POA: Diagnosis not present

## 2024-08-16 DIAGNOSIS — K635 Polyp of colon: Secondary | ICD-10-CM | POA: Diagnosis not present

## 2024-08-16 DIAGNOSIS — Z980 Intestinal bypass and anastomosis status: Secondary | ICD-10-CM

## 2024-08-30 ENCOUNTER — Other Ambulatory Visit: Payer: Self-pay | Admitting: Internal Medicine

## 2024-08-30 DIAGNOSIS — Z1231 Encounter for screening mammogram for malignant neoplasm of breast: Secondary | ICD-10-CM

## 2024-10-06 ENCOUNTER — Inpatient Hospital Stay: Admission: RE | Admit: 2024-10-06 | Discharge: 2024-10-06 | Attending: Internal Medicine | Admitting: Internal Medicine

## 2024-10-06 DIAGNOSIS — Z1231 Encounter for screening mammogram for malignant neoplasm of breast: Secondary | ICD-10-CM | POA: Insufficient documentation

## 2024-10-08 ENCOUNTER — Other Ambulatory Visit: Payer: Self-pay | Admitting: Medical Genetics

## 2024-10-20 ENCOUNTER — Other Ambulatory Visit
Admission: RE | Admit: 2024-10-20 | Discharge: 2024-10-20 | Disposition: A | Discharge status: Home / Self Care | Payer: Self-pay | Source: Ambulatory Visit | Attending: Medical Genetics | Admitting: Medical Genetics

## 2024-10-31 LAB — GENECONNECT MOLECULAR SCREEN: Genetic Analysis Overall Interpretation: NEGATIVE

## 2024-12-29 ENCOUNTER — Other Ambulatory Visit
